# Patient Record
Sex: Female | Born: 1970 | Race: White | Hispanic: No | Marital: Married | Smoking: Never smoker
Health system: Southern US, Community
[De-identification: ages and names within clinical notes are randomized; demographics above are authoritative.]

## PROBLEM LIST (undated history)

## (undated) ENCOUNTER — Emergency Department (HOSPITAL_BASED_OUTPATIENT_CLINIC_OR_DEPARTMENT_OTHER): Admission: EM | Payer: Medicaid Other | Source: Home / Self Care

## (undated) DIAGNOSIS — Z8041 Family history of malignant neoplasm of ovary: Secondary | ICD-10-CM

## (undated) DIAGNOSIS — Z808 Family history of malignant neoplasm of other organs or systems: Secondary | ICD-10-CM

## (undated) DIAGNOSIS — Z803 Family history of malignant neoplasm of breast: Secondary | ICD-10-CM

## (undated) HISTORY — PX: BACK SURGERY: SHX140

## (undated) HISTORY — DX: Family history of malignant neoplasm of breast: Z80.3

## (undated) HISTORY — DX: Family history of malignant neoplasm of other organs or systems: Z80.8

## (undated) HISTORY — DX: Family history of malignant neoplasm of ovary: Z80.41

---

## 2000-12-12 ENCOUNTER — Other Ambulatory Visit: Admission: RE | Admit: 2000-12-12 | Discharge: 2000-12-12 | Payer: Self-pay | Admitting: Obstetrics and Gynecology

## 2001-02-19 ENCOUNTER — Emergency Department (HOSPITAL_COMMUNITY): Admission: EM | Admit: 2001-02-19 | Discharge: 2001-02-19 | Payer: Self-pay | Admitting: Emergency Medicine

## 2001-02-20 ENCOUNTER — Inpatient Hospital Stay (HOSPITAL_COMMUNITY): Admission: AD | Admit: 2001-02-20 | Discharge: 2001-02-20 | Payer: Self-pay | Admitting: Obstetrics & Gynecology

## 2001-02-21 ENCOUNTER — Encounter: Payer: Self-pay | Admitting: Obstetrics & Gynecology

## 2001-02-21 ENCOUNTER — Observation Stay (HOSPITAL_COMMUNITY): Admission: AD | Admit: 2001-02-21 | Discharge: 2001-02-21 | Payer: Self-pay | Admitting: Obstetrics & Gynecology

## 2001-05-15 ENCOUNTER — Inpatient Hospital Stay (HOSPITAL_COMMUNITY): Admission: AD | Admit: 2001-05-15 | Discharge: 2001-05-15 | Payer: Self-pay | Admitting: Obstetrics and Gynecology

## 2001-07-30 ENCOUNTER — Inpatient Hospital Stay (HOSPITAL_COMMUNITY): Admission: AD | Admit: 2001-07-30 | Discharge: 2001-08-03 | Payer: Self-pay | Admitting: *Deleted

## 2001-12-23 ENCOUNTER — Emergency Department (HOSPITAL_COMMUNITY): Admission: EM | Admit: 2001-12-23 | Discharge: 2001-12-23 | Payer: Self-pay | Admitting: Emergency Medicine

## 2001-12-23 ENCOUNTER — Encounter: Payer: Self-pay | Admitting: Emergency Medicine

## 2001-12-23 ENCOUNTER — Encounter: Payer: Self-pay | Admitting: Cardiology

## 2002-03-17 ENCOUNTER — Ambulatory Visit (HOSPITAL_COMMUNITY): Admission: RE | Admit: 2002-03-17 | Discharge: 2002-03-18 | Payer: Self-pay | Admitting: Internal Medicine

## 2002-03-18 ENCOUNTER — Encounter: Payer: Self-pay | Admitting: Internal Medicine

## 2002-03-18 ENCOUNTER — Encounter: Payer: Self-pay | Admitting: Cardiology

## 2002-05-31 ENCOUNTER — Encounter: Payer: Self-pay | Admitting: Emergency Medicine

## 2002-05-31 ENCOUNTER — Emergency Department (HOSPITAL_COMMUNITY): Admission: EM | Admit: 2002-05-31 | Discharge: 2002-05-31 | Payer: Self-pay | Admitting: Emergency Medicine

## 2002-06-23 ENCOUNTER — Encounter: Payer: Self-pay | Admitting: *Deleted

## 2002-06-23 ENCOUNTER — Encounter: Admission: RE | Admit: 2002-06-23 | Discharge: 2002-06-23 | Payer: Self-pay | Admitting: *Deleted

## 2002-07-07 ENCOUNTER — Encounter: Admission: RE | Admit: 2002-07-07 | Discharge: 2002-07-07 | Payer: Self-pay | Admitting: *Deleted

## 2002-07-07 ENCOUNTER — Encounter: Payer: Self-pay | Admitting: *Deleted

## 2002-07-15 ENCOUNTER — Encounter: Payer: Self-pay | Admitting: Family Medicine

## 2002-07-15 ENCOUNTER — Encounter: Admission: RE | Admit: 2002-07-15 | Discharge: 2002-07-15 | Payer: Self-pay | Admitting: Family Medicine

## 2002-10-17 ENCOUNTER — Encounter: Payer: Self-pay | Admitting: Emergency Medicine

## 2002-10-17 ENCOUNTER — Emergency Department (HOSPITAL_COMMUNITY): Admission: EM | Admit: 2002-10-17 | Discharge: 2002-10-17 | Payer: Self-pay | Admitting: Emergency Medicine

## 2002-12-29 ENCOUNTER — Encounter: Payer: Self-pay | Admitting: General Surgery

## 2002-12-29 ENCOUNTER — Encounter: Admission: RE | Admit: 2002-12-29 | Discharge: 2002-12-29 | Payer: Self-pay | Admitting: General Surgery

## 2003-02-15 ENCOUNTER — Encounter: Admission: RE | Admit: 2003-02-15 | Discharge: 2003-02-15 | Payer: Self-pay | Admitting: *Deleted

## 2003-02-15 ENCOUNTER — Encounter: Payer: Self-pay | Admitting: Radiology

## 2003-02-15 ENCOUNTER — Encounter: Payer: Self-pay | Admitting: *Deleted

## 2003-03-14 ENCOUNTER — Inpatient Hospital Stay (HOSPITAL_COMMUNITY): Admission: RE | Admit: 2003-03-14 | Discharge: 2003-03-16 | Payer: Self-pay | Admitting: Orthopaedic Surgery

## 2003-07-31 ENCOUNTER — Inpatient Hospital Stay (HOSPITAL_COMMUNITY): Admission: AD | Admit: 2003-07-31 | Discharge: 2003-07-31 | Payer: Self-pay | Admitting: *Deleted

## 2003-08-12 ENCOUNTER — Encounter: Payer: Self-pay | Admitting: *Deleted

## 2003-08-12 ENCOUNTER — Inpatient Hospital Stay (HOSPITAL_COMMUNITY): Admission: AD | Admit: 2003-08-12 | Discharge: 2003-08-16 | Payer: Self-pay | Admitting: *Deleted

## 2003-08-12 ENCOUNTER — Encounter (INDEPENDENT_AMBULATORY_CARE_PROVIDER_SITE_OTHER): Payer: Self-pay | Admitting: Specialist

## 2003-09-02 ENCOUNTER — Encounter: Admission: RE | Admit: 2003-09-02 | Discharge: 2003-09-02 | Payer: Self-pay | Admitting: Family Medicine

## 2003-09-20 ENCOUNTER — Encounter: Admission: RE | Admit: 2003-09-20 | Discharge: 2003-09-20 | Payer: Self-pay | Admitting: *Deleted

## 2003-09-21 ENCOUNTER — Inpatient Hospital Stay (HOSPITAL_COMMUNITY): Admission: AD | Admit: 2003-09-21 | Discharge: 2003-09-21 | Payer: Self-pay | Admitting: Obstetrics and Gynecology

## 2003-10-06 ENCOUNTER — Encounter: Admission: RE | Admit: 2003-10-06 | Discharge: 2003-10-06 | Payer: Self-pay | Admitting: *Deleted

## 2003-10-13 ENCOUNTER — Ambulatory Visit (HOSPITAL_COMMUNITY): Admission: RE | Admit: 2003-10-13 | Discharge: 2003-10-13 | Payer: Self-pay | Admitting: *Deleted

## 2003-11-03 ENCOUNTER — Encounter: Admission: RE | Admit: 2003-11-03 | Discharge: 2003-11-03 | Payer: Self-pay | Admitting: Family Medicine

## 2003-11-17 ENCOUNTER — Encounter: Admission: RE | Admit: 2003-11-17 | Discharge: 2003-11-17 | Payer: Self-pay | Admitting: Family Medicine

## 2003-12-01 ENCOUNTER — Encounter: Admission: RE | Admit: 2003-12-01 | Discharge: 2003-12-01 | Payer: Self-pay | Admitting: Family Medicine

## 2003-12-15 ENCOUNTER — Encounter: Admission: RE | Admit: 2003-12-15 | Discharge: 2003-12-15 | Payer: Self-pay | Admitting: Family Medicine

## 2003-12-22 ENCOUNTER — Encounter: Admission: RE | Admit: 2003-12-22 | Discharge: 2003-12-22 | Payer: Self-pay | Admitting: *Deleted

## 2003-12-22 ENCOUNTER — Inpatient Hospital Stay (HOSPITAL_COMMUNITY): Admission: RE | Admit: 2003-12-22 | Discharge: 2003-12-22 | Payer: Self-pay | Admitting: Family Medicine

## 2003-12-29 ENCOUNTER — Encounter: Admission: RE | Admit: 2003-12-29 | Discharge: 2003-12-29 | Payer: Self-pay | Admitting: Family Medicine

## 2004-01-12 ENCOUNTER — Encounter: Admission: RE | Admit: 2004-01-12 | Discharge: 2004-01-12 | Payer: Self-pay | Admitting: Family Medicine

## 2004-01-19 ENCOUNTER — Encounter: Admission: RE | Admit: 2004-01-19 | Discharge: 2004-01-19 | Payer: Self-pay | Admitting: Family Medicine

## 2004-02-16 ENCOUNTER — Ambulatory Visit: Payer: Self-pay | Admitting: Family Medicine

## 2004-02-23 ENCOUNTER — Ambulatory Visit: Payer: Self-pay | Admitting: Family Medicine

## 2004-03-01 ENCOUNTER — Ambulatory Visit: Payer: Self-pay | Admitting: Family Medicine

## 2004-03-15 ENCOUNTER — Ambulatory Visit: Payer: Self-pay | Admitting: Family Medicine

## 2004-03-15 ENCOUNTER — Inpatient Hospital Stay (HOSPITAL_COMMUNITY): Admission: AD | Admit: 2004-03-15 | Discharge: 2004-03-15 | Payer: Self-pay | Admitting: Family Medicine

## 2004-03-19 ENCOUNTER — Encounter (INDEPENDENT_AMBULATORY_CARE_PROVIDER_SITE_OTHER): Payer: Self-pay | Admitting: Specialist

## 2004-03-19 ENCOUNTER — Inpatient Hospital Stay (HOSPITAL_COMMUNITY): Admission: AD | Admit: 2004-03-19 | Discharge: 2004-03-21 | Payer: Self-pay | Admitting: Family Medicine

## 2004-04-07 ENCOUNTER — Inpatient Hospital Stay (HOSPITAL_COMMUNITY): Admission: AD | Admit: 2004-04-07 | Discharge: 2004-04-07 | Payer: Self-pay | Admitting: *Deleted

## 2004-05-22 ENCOUNTER — Encounter: Admission: RE | Admit: 2004-05-22 | Discharge: 2004-05-22 | Payer: Self-pay | Admitting: Family Medicine

## 2005-01-06 ENCOUNTER — Inpatient Hospital Stay (HOSPITAL_COMMUNITY): Admission: AD | Admit: 2005-01-06 | Discharge: 2005-01-06 | Payer: Self-pay | Admitting: *Deleted

## 2005-01-17 ENCOUNTER — Ambulatory Visit: Payer: Self-pay | Admitting: Family Medicine

## 2005-02-28 ENCOUNTER — Ambulatory Visit: Payer: Self-pay | Admitting: Obstetrics & Gynecology

## 2005-03-21 ENCOUNTER — Ambulatory Visit: Payer: Self-pay | Admitting: Family Medicine

## 2005-04-11 ENCOUNTER — Ambulatory Visit: Payer: Self-pay | Admitting: Family Medicine

## 2005-04-17 ENCOUNTER — Ambulatory Visit (HOSPITAL_COMMUNITY): Admission: RE | Admit: 2005-04-17 | Discharge: 2005-04-17 | Payer: Self-pay | Admitting: Obstetrics and Gynecology

## 2005-05-02 ENCOUNTER — Ambulatory Visit: Payer: Self-pay | Admitting: *Deleted

## 2005-06-06 ENCOUNTER — Inpatient Hospital Stay (HOSPITAL_COMMUNITY): Admission: AD | Admit: 2005-06-06 | Discharge: 2005-06-06 | Payer: Self-pay | Admitting: *Deleted

## 2005-06-06 ENCOUNTER — Ambulatory Visit: Payer: Self-pay | Admitting: Family Medicine

## 2005-06-20 ENCOUNTER — Ambulatory Visit: Payer: Self-pay | Admitting: Family Medicine

## 2005-07-11 ENCOUNTER — Ambulatory Visit: Payer: Self-pay | Admitting: Family Medicine

## 2005-07-25 ENCOUNTER — Ambulatory Visit: Payer: Self-pay | Admitting: Family Medicine

## 2005-08-08 ENCOUNTER — Ambulatory Visit: Payer: Self-pay | Admitting: Family Medicine

## 2005-08-15 ENCOUNTER — Ambulatory Visit (HOSPITAL_COMMUNITY): Admission: RE | Admit: 2005-08-15 | Discharge: 2005-08-15 | Payer: Self-pay | Admitting: Obstetrics and Gynecology

## 2005-08-15 ENCOUNTER — Ambulatory Visit: Payer: Self-pay | Admitting: Family Medicine

## 2005-08-29 ENCOUNTER — Ambulatory Visit: Payer: Self-pay | Admitting: Gynecology

## 2005-08-31 ENCOUNTER — Inpatient Hospital Stay (HOSPITAL_COMMUNITY): Admission: AD | Admit: 2005-08-31 | Discharge: 2005-09-02 | Payer: Self-pay | Admitting: Family Medicine

## 2005-08-31 ENCOUNTER — Ambulatory Visit: Payer: Self-pay | Admitting: Family Medicine

## 2005-09-05 ENCOUNTER — Encounter: Admission: RE | Admit: 2005-09-05 | Discharge: 2005-10-05 | Payer: Self-pay | Admitting: Family Medicine

## 2005-09-05 ENCOUNTER — Inpatient Hospital Stay (HOSPITAL_COMMUNITY): Admission: AD | Admit: 2005-09-05 | Discharge: 2005-09-05 | Payer: Self-pay | Admitting: Family Medicine

## 2005-09-05 ENCOUNTER — Ambulatory Visit: Admission: RE | Admit: 2005-09-05 | Discharge: 2005-09-05 | Payer: Self-pay | Admitting: Family Medicine

## 2005-10-06 ENCOUNTER — Encounter: Admission: RE | Admit: 2005-10-06 | Discharge: 2005-11-04 | Payer: Self-pay | Admitting: Family Medicine

## 2005-10-24 ENCOUNTER — Ambulatory Visit: Payer: Self-pay | Admitting: Family Medicine

## 2005-11-05 ENCOUNTER — Encounter: Admission: RE | Admit: 2005-11-05 | Discharge: 2005-12-05 | Payer: Self-pay | Admitting: Family Medicine

## 2005-12-06 ENCOUNTER — Encounter: Admission: RE | Admit: 2005-12-06 | Discharge: 2006-01-04 | Payer: Self-pay | Admitting: Family Medicine

## 2005-12-27 ENCOUNTER — Encounter (INDEPENDENT_AMBULATORY_CARE_PROVIDER_SITE_OTHER): Payer: Self-pay | Admitting: Specialist

## 2005-12-27 ENCOUNTER — Ambulatory Visit: Payer: Self-pay | Admitting: Family Medicine

## 2005-12-27 ENCOUNTER — Other Ambulatory Visit: Admission: RE | Admit: 2005-12-27 | Discharge: 2005-12-27 | Payer: Self-pay | Admitting: Obstetrics and Gynecology

## 2006-01-05 ENCOUNTER — Encounter: Admission: RE | Admit: 2006-01-05 | Discharge: 2006-02-04 | Payer: Self-pay | Admitting: Family Medicine

## 2007-03-12 ENCOUNTER — Encounter: Admission: RE | Admit: 2007-03-12 | Discharge: 2007-03-12 | Payer: Self-pay | Admitting: Obstetrics and Gynecology

## 2008-11-17 ENCOUNTER — Emergency Department (HOSPITAL_BASED_OUTPATIENT_CLINIC_OR_DEPARTMENT_OTHER): Admission: EM | Admit: 2008-11-17 | Discharge: 2008-11-17 | Payer: Self-pay | Admitting: Emergency Medicine

## 2008-11-17 ENCOUNTER — Ambulatory Visit: Payer: Self-pay | Admitting: Radiology

## 2008-11-22 ENCOUNTER — Ambulatory Visit (HOSPITAL_COMMUNITY): Admission: RE | Admit: 2008-11-22 | Discharge: 2008-11-22 | Payer: Self-pay | Admitting: Emergency Medicine

## 2008-12-10 ENCOUNTER — Emergency Department (HOSPITAL_BASED_OUTPATIENT_CLINIC_OR_DEPARTMENT_OTHER): Admission: EM | Admit: 2008-12-10 | Discharge: 2008-12-10 | Payer: Self-pay | Admitting: Emergency Medicine

## 2008-12-22 ENCOUNTER — Ambulatory Visit (HOSPITAL_COMMUNITY): Admission: RE | Admit: 2008-12-22 | Discharge: 2008-12-22 | Payer: Self-pay | Admitting: Neurosurgery

## 2008-12-22 ENCOUNTER — Encounter: Admission: RE | Admit: 2008-12-22 | Discharge: 2008-12-22 | Payer: Self-pay | Admitting: Neurosurgery

## 2008-12-23 ENCOUNTER — Ambulatory Visit: Payer: Self-pay | Admitting: Internal Medicine

## 2008-12-23 ENCOUNTER — Observation Stay (HOSPITAL_COMMUNITY): Admission: EM | Admit: 2008-12-23 | Discharge: 2008-12-26 | Payer: Self-pay | Admitting: Emergency Medicine

## 2009-04-23 ENCOUNTER — Ambulatory Visit (HOSPITAL_COMMUNITY): Admission: RE | Admit: 2009-04-23 | Discharge: 2009-04-23 | Payer: Self-pay | Admitting: Neurosurgery

## 2009-05-03 ENCOUNTER — Ambulatory Visit (HOSPITAL_COMMUNITY): Admission: RE | Admit: 2009-05-03 | Discharge: 2009-05-03 | Payer: Self-pay | Admitting: Obstetrics

## 2009-08-15 ENCOUNTER — Ambulatory Visit: Payer: Self-pay | Admitting: Diagnostic Radiology

## 2009-08-15 ENCOUNTER — Emergency Department (HOSPITAL_BASED_OUTPATIENT_CLINIC_OR_DEPARTMENT_OTHER): Admission: EM | Admit: 2009-08-15 | Discharge: 2009-08-15 | Payer: Self-pay | Admitting: Emergency Medicine

## 2009-10-21 ENCOUNTER — Ambulatory Visit (HOSPITAL_COMMUNITY): Admission: RE | Admit: 2009-10-21 | Discharge: 2009-10-21 | Payer: Self-pay | Admitting: Neurosurgery

## 2010-06-06 IMAGING — CR DG CERVICAL SPINE 2 OR 3 VIEWS
1 series · 1 of 1 positions shown · non-contrast
Comparison: None.

CLINICAL DATA: ACDF.

CERVICAL SPINE - 2-3 VIEW

[view not recorded]
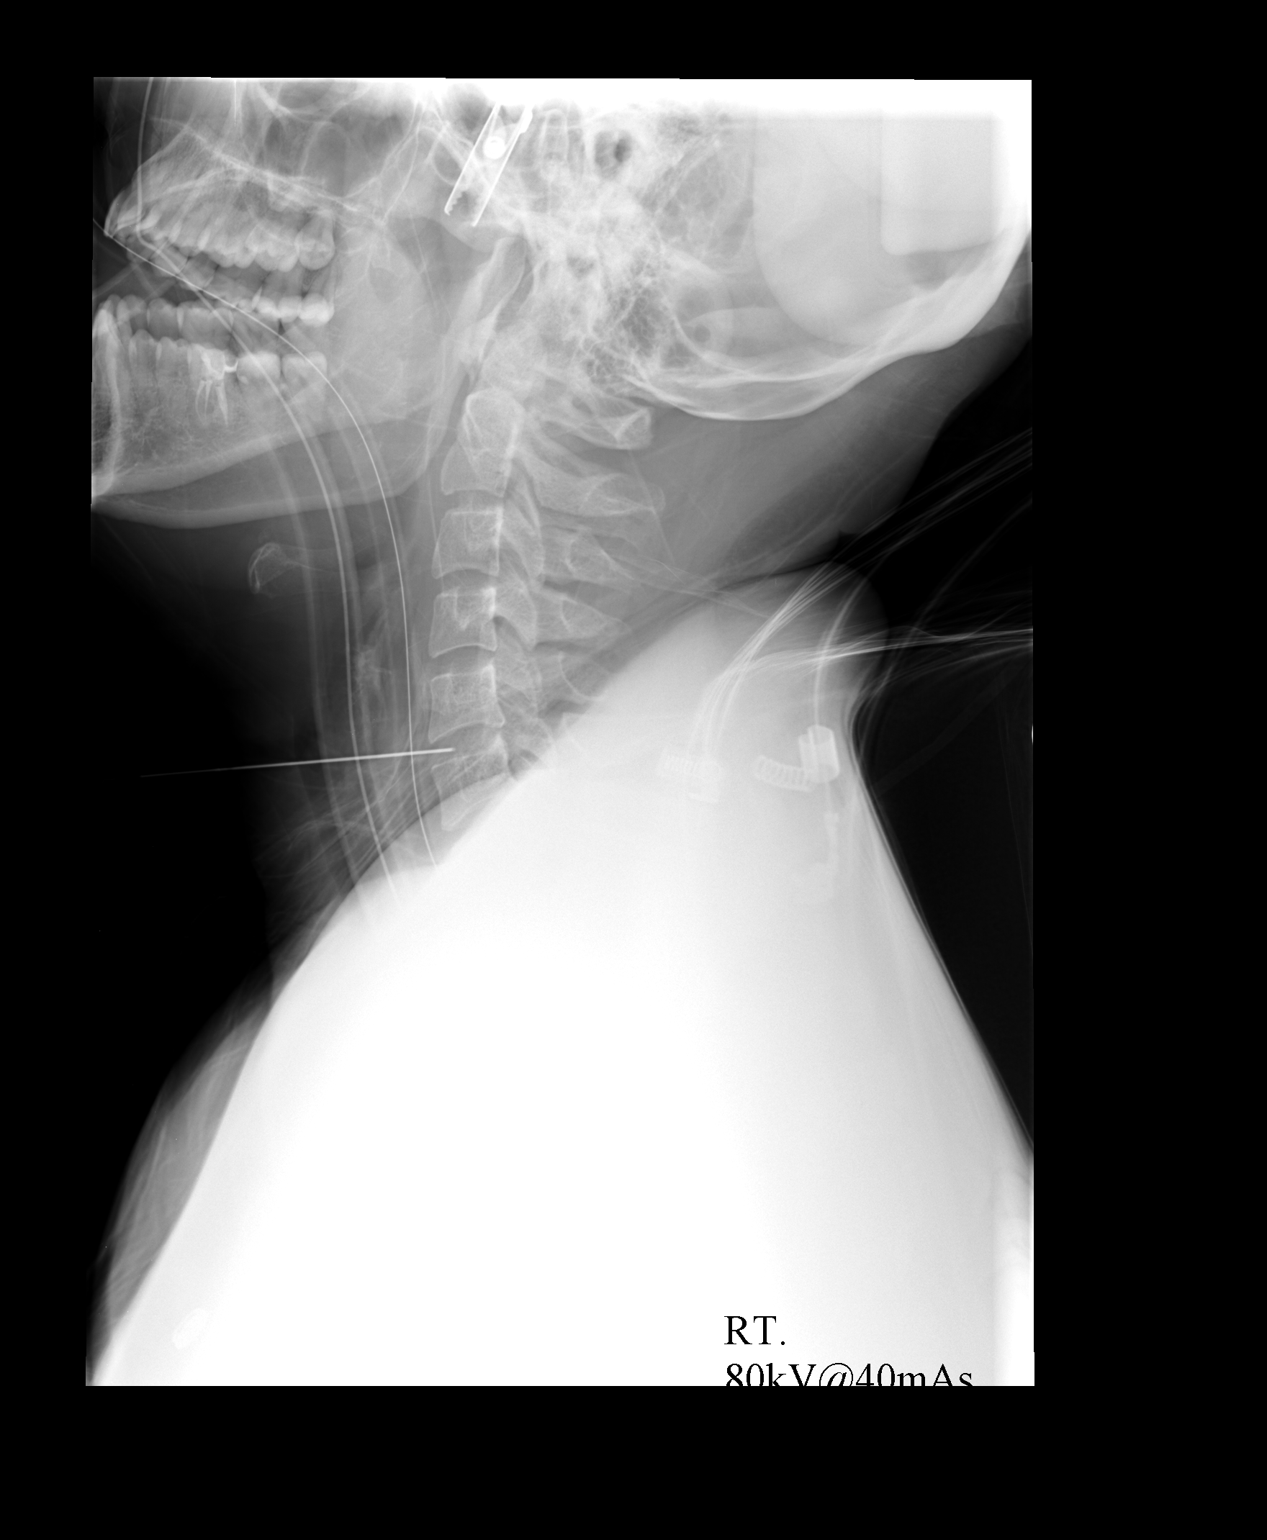

[1 of 1 positions shown; findings below may reference images not displayed]

FINDINGS: Cross-table lateral view of the cervical spine obtained
portably at 4211 hours is labeled #1.  There is a radiopaque needle
seen from an anterior approach.  Needle tip overlies the anterior
C5-6 interspace.

The film labeled #2 is obtained at 7771 hours.  The patient has had
interval cervical discectomy at C6-7 with anterior plate seen
across the C6-7 interspace.  The caudal extent of the hardware is
not visualized.
IMPRESSION: Status post ACDF at C6-7.  The C7 level has not been clearly
visualized secondary to overlying soft tissue.

## 2010-07-07 ENCOUNTER — Encounter: Payer: Self-pay | Admitting: Family Medicine

## 2010-09-10 ENCOUNTER — Ambulatory Visit: Payer: Self-pay | Admitting: Advanced Practice Midwife

## 2010-09-23 LAB — DIFFERENTIAL
Eosinophils Relative: 0 % (ref 0–5)
Lymphs Abs: 1.4 10*3/uL (ref 0.7–4.0)
Monocytes Absolute: 0.4 10*3/uL (ref 0.1–1.0)
Monocytes Relative: 4 % (ref 3–12)
Neutro Abs: 7 10*3/uL (ref 1.7–7.7)

## 2010-09-23 LAB — POCT I-STAT, CHEM 8
BUN: 13 mg/dL (ref 6–23)
Calcium, Ion: 1.21 mmol/L (ref 1.12–1.32)
Chloride: 110 mEq/L (ref 96–112)
Creatinine, Ser: 0.5 mg/dL (ref 0.4–1.2)
Glucose, Bld: 97 mg/dL (ref 70–99)
HCT: 36 % (ref 36.0–46.0)
Hemoglobin: 12.2 g/dL (ref 12.0–15.0)
Potassium: 4.2 mEq/L (ref 3.5–5.1)
Sodium: 141 mEq/L (ref 135–145)
TCO2: 23 mmol/L (ref 0–100)

## 2010-09-23 LAB — HEPATITIS PANEL, ACUTE
HCV Ab: NEGATIVE
Hep A IgM: NEGATIVE
Hepatitis B Surface Ag: NEGATIVE

## 2010-09-23 LAB — PROTIME-INR
INR: 1 (ref 0.00–1.49)
Prothrombin Time: 13.9 s (ref 11.6–15.2)

## 2010-09-23 LAB — COMPREHENSIVE METABOLIC PANEL
ALT: 47 U/L — ABNORMAL HIGH (ref 0–35)
Alkaline Phosphatase: 34 U/L — ABNORMAL LOW (ref 39–117)
BUN: 9 mg/dL (ref 6–23)
Calcium: 8.5 mg/dL (ref 8.4–10.5)
Total Protein: 6.5 g/dL (ref 6.0–8.3)

## 2010-09-23 LAB — PREGNANCY, URINE: Preg Test, Ur: NEGATIVE

## 2010-09-23 LAB — CBC
HCT: 33.3 % — ABNORMAL LOW (ref 36.0–46.0)
Hemoglobin: 11.6 g/dL — ABNORMAL LOW (ref 12.0–15.0)
MCHC: 35 g/dL (ref 30.0–36.0)
RDW: 13 % (ref 11.5–15.5)
WBC: 8.9 10*3/uL (ref 4.0–10.5)

## 2010-09-23 LAB — APTT: aPTT: 28 seconds (ref 24–37)

## 2010-09-24 LAB — POCT CARDIAC MARKERS
CKMB, poc: <1 ng/mL — ABNORMAL LOW (ref 1.0–8.0)
Myoglobin, poc: 33.5 ng/mL (ref 12–200)
Troponin i, poc: <0.05 ng/mL (ref 0.00–0.09)

## 2010-09-28 ENCOUNTER — Other Ambulatory Visit: Payer: Self-pay | Admitting: Family

## 2010-09-28 ENCOUNTER — Ambulatory Visit (INDEPENDENT_AMBULATORY_CARE_PROVIDER_SITE_OTHER): Payer: Self-pay

## 2010-09-28 DIAGNOSIS — Z01419 Encounter for gynecological examination (general) (routine) without abnormal findings: Secondary | ICD-10-CM

## 2010-09-29 NOTE — Progress Notes (Signed)
NAME:  [name] [name], [name] [name]                  ACCOUNT NO.:  [id number]  MEDICAL RECORD NO.:  [id number]           PATIENT TYPE:  A  LOCATION:  [facility]                   FACILITY:  [facility]  PHYSICIAN:  Sid Falcon, CNM  DATE OF BIRTH:  April 23, 1971  DATE OF SERVICE:  09/28/2010                                 CLINIC NOTE  REASON FOR VISIT:  For IUD removal and consultation.  HISTORY OF PRESENT ILLNESS:  The patient is here with report of having Mirena IUD placed 5 years ago due for removal at the beginning of May 2012.  The patient is here wanting to apply for a [facility] scholarship for Mirena, unable to secure additional financial means for placement.  In addition, the patient is here for an annual well-woman exam.  MEDICATIONS:  None.  MENSTRUAL HISTORY:  Last menstrual period, September 16, 2010 in a monogamous relationship, not trying to get pregnant history.  OBSTETRICAL HISTORY:  Five pregnancies with four children and a history of one ectopic pregnancy in 2005.  GYNECOLOGIC HISTORY:  Last Pap smear 5 years ago.  The patient denies history of abnormal Pap.  STD HISTORY:  Denies.  PAST MEDICAL HISTORY:  Supraventricular tachycardia and neck pain and back pain.  PAST SURGICAL HISTORY:  Cervical decompression in 2001 and also had L4- L5 microdiskectomy.  Had a right salpingectomy and oophorectomy in 2005 and had a cardiac ablation to correct SBT in 2003.  SOCIAL HISTORY:  The patient is not working, does not smoke, drink alcohol, or use illicit drugs.  Denies sexual or physical abuse.  FAMILY HISTORY:  Mother with diabetes.  Father with heart disease. Mother with high blood pressure.  Mother with breast cancer diagnosed at age 31.  REVIEW OF SYSTEMS:  The patient denies any problems or concerns at this visit.  PHYSICAL EXAMINATION:  VITAL SIGNS:  Stable.  Temperature 99, pulse 85, and blood pressure 108/75. GENERAL:  Alert and oriented x3.  No signs of acute  distress. NECK:  No thyromegaly, no dominant masses, and no nodules. CARDIOVASCULAR:  Regular rate and rhythm without murmurs, gallops, or rubs. LUNGS:  Clear to auscultation bilaterally. BREASTS:  Soft, nontender, and no dominant masses.  No retractions.  No dimpling.  No nipple discharge. ABDOMEN:  No hepatosplenomegaly. PELVIS:  Vagina:  No abnormal lesions, no abnormal discharge.  Cervix: Visualized without difficulty, no abnormal lesions, no abnormal discharge, negative cervical motion tenderness.  Adnexa:  No dominant masses, nontender with palpation.  ASSESSMENT: 1. Well-woman exam. 2. Family planning counseling.  PLAN:  The patient to apply for both the [employer] scholarship and [employer].  LABORATORY DATA:  Pap smear sent to lab.  The patient will follow up for hopeful IUD removal and insertion at next visit.     Sid Falcon, CNM    WM/MEDQ  D:  09/28/2010  T:  09/29/2010  Job:  [id number]

## 2010-10-22 ENCOUNTER — Other Ambulatory Visit: Payer: Self-pay | Admitting: Family Medicine

## 2010-10-22 DIAGNOSIS — Z1231 Encounter for screening mammogram for malignant neoplasm of breast: Secondary | ICD-10-CM

## 2010-10-23 ENCOUNTER — Ambulatory Visit (HOSPITAL_COMMUNITY): Payer: Self-pay

## 2010-10-23 ENCOUNTER — Ambulatory Visit (HOSPITAL_COMMUNITY)
Admission: RE | Admit: 2010-10-23 | Discharge: 2010-10-23 | Disposition: A | Discharge status: Home / Self Care | Payer: Self-pay | Source: Ambulatory Visit | Attending: Family Medicine | Admitting: Family Medicine

## 2010-10-23 DIAGNOSIS — Z1231 Encounter for screening mammogram for malignant neoplasm of breast: Secondary | ICD-10-CM

## 2010-10-29 ENCOUNTER — Ambulatory Visit (HOSPITAL_COMMUNITY): Payer: Self-pay

## 2010-10-30 NOTE — Discharge Summary (Signed)
NAME:  Vessell, [name]                  ACCOUNT NO.:  [id number]   MEDICAL RECORD NO.:  [id number]          PATIENT TYPE:  OBV   LOCATION:  3023                         FACILITY:  [facility]   PHYSICIAN:  Coletta Memos, M.D.     DATE OF BIRTH:  July 08, 1970   DATE OF ADMISSION:  12/23/2008  DATE OF DISCHARGE:  12/26/2008                               DISCHARGE SUMMARY   ADMITTING DIAGNOSIS:  Neck pain.   DISCHARGE DIAGNOSES:  1. 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  2. Left C7 radiculopathy.   INDICATIONS:  [name] [name] is a 40 year old who had been seen by my  partner Dr. Trey Sailors for evaluation of a herniated disk at C6-7 on left  side.  He arranged and she agreed to undergo epidural steroid  injections.  She had a first injection on Thursday, December 22, 2008.  She  experienced greater pain in left upper extremity and came to the  hospital emergency room.  She was seen and admitted by the Medicine  Service.  At that time, they are recorded her as having a strength of  1/5 in the left upper extremity.  However, there is absolutely nothing  to believe that she had a flaccid extremity.  They spoke with Dr. Channing Mutters  and developed a plan.  However, over the weekend, they asked for another  consultation with regards to Ms. [name].  I then at that time offered her  operative decompression that she would like and she agreed.  We did this  yesterday, December 25, 2008.  She had an anterior cervical decompression  and arthrodesis C6-7, with factor plate.  Postoperatively, she has done  well.  She has some numbness in the upper and her fingers on the left  hand.  Outside of that she is doing well.  Wound is clean and dry with  no signs of infection.  She is tolerating a regular diet, voiding and  ambulating without difficulty.  Wound is clean and dry.  She will be  discharged home.   MEDICATIONS:  Dilaudid and Flexeril.           ______________________________  Coletta Memos, M.D.     KC/MEDQ  D:  12/26/2008  T:   12/27/2008  Job:  [id number]

## 2010-10-30 NOTE — Discharge Summary (Signed)
NAME:  [name], [name]                  ACCOUNT NO.:  [id number]   MEDICAL RECORD NO.:  [id number]          PATIENT TYPE:  OBV   LOCATION:  3023                         FACILITY:  [facility]   PHYSICIAN:  Alvester Morin, M.D.  DATE OF BIRTH:  1971-04-28   DATE OF ADMISSION:  12/23/2008  DATE OF DISCHARGE:  12/26/2008                               DISCHARGE SUMMARY   ATTENDING PHYSICIAN:  Alvester Morin, M.D.   DISCHARGE DIAGNOSES:  1. Displaced disk cervical spine left C6 to 7 with a left C7      radiculopathy.  2. Elevated transaminases.   DISCHARGE MEDICATIONS:  Per Neurosurgery were:  1. Dilaudid 1 mg q.4 h. as needed for pain.  2. Valium 5 mg q.8 h. as needed for pain.   DISPOSITION AND FOLLOWUP:  The patient is to follow up with Dr. Coletta Memos, Neurosurgery in 2 to 3 weeks, following the surgery that was  performed during this admission.   PROCEDURES PERFORMED:  1. Anterior cervical decompression C6 to 7.  2. MRI of the cervical spine with and without contrast.   CONSULTATION:  Coletta Memos, M.D., Neurosurgery.   ADMITTING HISTORY AND PHYSICAL:  The patient is a 40 year old woman with  a past medical history significant for C6 to 7 disk herniation,  diagnosed via MRI about 1 month prior to admission and she is status  post an epidural injection near that cervical disk herniation site.  The  day prior to admission on December 22, 2008, she presented to the emergency  department complaining of severe neck pain which radiated to the left  arm, that started to the night prior to admission.  She describes the  pain as electric shock, sharp, fired, 10 out of 10 in intensity and she  also relates having numbness and that has been getting worse since the  day prior to admission.  She also complains of weakness in that left arm  which started at the same time as the numbness and increased pain.  She  also has a headache in the occipital area and which also began the day  prior to  admission.  She denies any fevers but does report chills,  sweating, and feeling hot.   ADMITTING LABS:  Upon arrival in the emergency department, the patient  did have point-of-care cardiac markers drawn, which showed a CK-MB of  less than 1.0, troponin I of less than 0.05 and myoglobin of 33.5, all  of which were within normal limits.  A STAT chemistry was drawn with  ionized calcium of 1.21, hemoglobin of 12.2, and hematocrit 36.0.  Sodium 141, potassium 4.2, chloride 110, glucose 97, BUN of 13, and  creatinine of 0.5.  CBC was drawn, which showed a white blood count of  8.9, hemoglobin of 11.6, hematocrit 33.3, platelets of 226.  Comprehensive metabolic panel showed sodium of 140, potassium 4.0,  chloride 113, bicarb 22, glucose 98, BUN 9, creatinine 0.46, a bilirubin  total 0.5, alkaline phosphatase 34, AST 43, ALT 47, total protein 6.5,  albumin 3.7, and calcium of 8.5.  Urine  pregnancy test was negative.   HOSPITAL COURSE BY PROBLEM:  1. Cervical disk herniation with C7 radiculopathy.  The patient was      admitted and she was began on a morphine patient controlled      analgesic and also Neurontin 300 mg 3 times daily, per      recommendations of Dr. Channing Mutters who had seen the patient in the past for      neck pain prior to her epidural injection there the day prior to      admission. The patient was seen the following day by colleague of      Dr. Channing Mutters, Dr. Coletta Memos who then performed a ACDF on Sunday July      11 , 2010.  The patient was seen the following day after surgery and      appeared much better with pain relief and was going to be followed      up as an outpatient by Dr. Franky Macho in 2 to 3 weeks.  He had written      her for pain medications as an outpatient and she was to take those      as directed from Dr. Franky Macho.  2. Elevated transaminases.  The patient was found to have an AST and      ALT of 43 and 47 respectively upon admission.  Hepatitis panel was      drawn,  which returned as being negative with a preliminary report.   DISCHARGE LABS AND VITALS:  On the day of discharge, the patient had  vitals of temperature 98.6, pulse 81, respirations 19, and blood  pressure of 107/68.  Labs on discharge included only the preliminary  results from the hepatitis panel, which were both negative for hepatitis  B surface antigen and hepatitis C antibody.      Nilda Riggs, MD  Electronically Signed      Alvester Morin, M.D.  Electronically Signed    ME/MEDQ  D:  12/26/2008  T:  12/27/2008  Job:  [id number]   cc:   Coletta Memos, M.D.

## 2010-10-30 NOTE — Op Note (Signed)
NAME:  [name] [name], [name] [name]                  ACCOUNT NO.:  [id number]   MEDICAL RECORD NO.:  [id number]          PATIENT TYPE:  OBV   LOCATION:  3023                         FACILITY:  [facility]   PHYSICIAN:  Coletta Memos, M.D.     DATE OF BIRTH:  02/14/1971   DATE OF PROCEDURE:  12/25/2008  DATE OF DISCHARGE:                               OPERATIVE REPORT   PREOPERATIVE DIAGNOSES:  1. 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  2. Left C7 radiculopathy.   POSTOPERATIVE DIAGNOSES:  1. 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  2. Left C7 radiculopathy.   PROCEDURES:  1. Anterior cervical decompression, C6-7.  2. Arthrodesis, C6-7 with a 7-mm structural allograft.  3. Anterior instrumentation with 14-mm Vectra plate with 14 mm screws      at C6-7.   COMPLICATIONS:  None.   SURGEON:  Coletta Memos, MD   ASSISTANT:  None.   ANESTHESIA:  General endotracheal.   INDICATIONS:  [name] [name] Potts presented with severe pain in the left upper  extremity after epidural steroid injection.  She had presented to the  [facility] with a very large herniated disk at C6-7.  With no  relief of her symptoms and with significant emotional distress on a part  of the patient, I felt the best to offer an operation and operative  decompression and she and her husband have agreed.   OPERATIVE NOTE:  Ms. Heumann was brought to the operating room, intubated,  and placed under general anesthetic.  The head was positioned on a  horseshoe headrest in a neutral position.  Her neck was prepped.  She  was draped in a sterile fashion.  I infiltrated 3 mL of 0.5% lidocaine  with 1:200,000 strength epinephrine at the level of the cricothyroid  cartilage.  I made an incision starting from the midline extending to  the medial border of the left sternocleidomastoid.  I opened the skin  and dissected to the platysma with Metzenbaum scissors.  I dissected in  a plane both superior and inferior to the platysma in a rostral and  caudal directions to increase my  working space.  After dividing the  platysma, I was able to dissect with both blunt and sharp techniques and  created an avascular corridor to the cervical spine.  I placed a spinal  needle, I was at C5-6.  I moved down one level.  I reflected the longus  colli muscles bilaterally overlying the C6-7 space.  I placed self-  retaining retractors and prepared for the decompression.  I also placed  2 distraction pins, one at C6 and one at C7.  I used a #15 blade, I  opened the disk space then with a #15 blade by incising the annulus and  then I distracted the space.   I decompressed the spinal canal at C6-7 by removing the disk and  herniated disk material prominent in both right and left sides, though  she had no right-sided symptoms.  I was able to fully decompressed both  C7 nerve roots.  I used a high-speed drill, Kerrison punches, and  pituitary rongeurs along  with curettes to perform the decompression.  Once that was performed, I turned my attention to the arthrodesis.   I sized the space and felt a 7-mm structural allograft would be best.  I  placed that without difficulty after using a high-speed bur to even the  surfaces at C6 and C7.  I irrigated.  I then placed anterior  instrumentation.   I used a 14-mm plate and four 54-UJ screws to span the disk space at C6-  7.  This was done without difficulty using self-tapping screws.  I then  irrigated the wound.  An x-ray was performed and it showed that the  plate plug-in screws were in the correct location.  I then closed the  wound in a layered fashion using Vicryl sutures to reapproximate the  platysma in subcuticular layers.  I used Dermabond for sterile dressing.  Ms. Badley was awakened, extubated, and moving all extremities well.           ______________________________  Coletta Memos, M.D.     KC/MEDQ  D:  12/25/2008  T:  12/26/2008  Job:  [id number]

## 2010-11-02 NOTE — Group Therapy Note (Signed)
NAME:  [name] [name], [name] [name]                  ACCOUNT NO.:  [id number]   MEDICAL RECORD NO.:  [id number]          PATIENT TYPE:  WOC   LOCATION:  [facility]                   FACILITY:  [facility]   PHYSICIAN:  Tinnie Gens, MD        DATE OF BIRTH:  [birth year]   DATE OF SERVICE:  12/27/2005                                    CLINIC NOTE   CHIEF COMPLAINT:  Vulvar irritation.   HISTORY OF PRESENT ILLNESS:  This patient is a 40 year old gravida 4, para 4-  0-1-4 who had an IUD inserted in May of this year.  She states that she has  had some spotting pretty much ever since that was inserted.  The patient  continues to breast feed.   The patient also complains of vulvar irritation.  She has noticed some spots  that sort of swell up and are very tender to touch and then go away.  They  come and go on different sides, have been present for about 3 weeks.  Her  partner does have a history of HSV, but she has never had an outbreak.  The  patient also notes just generalized vulvar irritation and itching that has  been present for some time and is pretty much constant.   PHYSICAL EXAMINATION:  VITAL SIGNS:  On exam today her vitals are noted in  the chart.  GENERAL:  She is a well-developed, well-nourished [place] female in  no acute distress.  GU:  Her vulva are very erythematous.  She does have some areas consistent  with folliculitis noted and probably are a response to shaving.  There is  one that is healing and is somewhat open, and an HSV culture was taken from  this spot.  The vagina is pink and rugae.  The cervix is without lesion.  IUD strings are present and visible.  Also noted is the presence of atrophy  of the labia minora and fusion to the underlying sidewall.  There is no  definitive whitening of the vulvar skin, but this is suspicious for lichen  sclerosus.   PROCEDURE:  After informed consent 0.5 mL of lidocaine was infiltrated into  the vulva and a punch biopsy was done with a  3.5-mm punch.  The patient  tolerated the procedure well, and hemostasis was obtained with silver  nitrate.   IMPRESSION:  1.  Spotting and bleeding after Mirena intrauterine device insertion.  The      intrauterine device is in normal position.  2.  Vulvar irritation and atrophy.  3.  Probable folliculitis.   PLAN:  1.  An HSV culture sent.  2.  Biopsy of the atrophic area was also performed.  3.  The patient was given a sample pack of Loestrin to see if this would      curb her bleeding.  4.  The patient will follow up in 2 weeks for results of her biopsy and      possible treatment options.  5.  The patient is also instructed to stop shaving and just use trimmers as      needed.  ______________________________  Tinnie Gens, MD     TP/MEDQ  D:  12/27/2005  T:  12/27/2005  Job:  [id number]

## 2010-11-02 NOTE — Group Therapy Note (Signed)
NAME:  [name] [name], [name] [name]                  ACCOUNT NO.:  [id number]   MEDICAL RECORD NO.:  [id number]          PATIENT TYPE:  WOC   LOCATION:  [facility]                   FACILITY:  [facility]   PHYSICIAN:  Tinnie Gens, MD        DATE OF BIRTH:  08/29/1970   DATE OF SERVICE:  10/24/2005                                    CLINIC NOTE   CHIEF COMPLAINT:  IV insertion.   HISTORY OF PRESENT ILLNESS:  The patient is a 40 year old gravida 4, para 4-  0-1-4 who had a heterotopic pregnancy but delivered a baby in March 2007 who  desires IUD insertion.  She continues to nurse.  She is doing well.  She has  no significant complaints.  She just had her first period that started  approximately four days ago.   PROCEDURES:  Speculum was placed inside the vagina.  Cervix was visualized,  grasped anteriorly with single toothed tenaculum.  Blood was noted coming  from the cervix.  The uterus was sounded to 8 cm without difficult.  Mirena  IUD was inserted without difficulty.  The strings trimmed to 1.5 cm.  The  patient tolerated the procedure well.   IMPRESSION:  IUD insertion.   PLAN:  The patient was encouraged to check the strings once a month for the  first few months.  She is cautioned about abnormal uterine bleeding, and was  told to follow up in three months for a Pap smear.           ______________________________  Tinnie Gens, MD     TP/MEDQ  D:  10/24/2005  T:  10/25/2005  Job:  [id number]

## 2010-11-02 NOTE — Op Note (Signed)
NAME:  [name] [name], [name] [name]                            ACCOUNT NO.:  [id number]   MEDICAL RECORD NO.:  [id number]                   PATIENT TYPE:  MAT   LOCATION:  [facility]                                 [facility]   PHYSICIAN:  Tanya S. Shawnie Pons, M.D.                DATE OF BIRTH:  1971/02/18   DATE OF PROCEDURE:  08/12/2003  DATE OF DISCHARGE:                                 OPERATIVE REPORT   PREOPERATIVE DIAGNOSES:  1. Intrauterine pregnancy at eight weeks.  2. Right ectopic, ruptured.   POSTOPERATIVE DIAGNOSES:  1. Intrauterine pregnancy at eight weeks.  2. Right ectopic, ruptured.   PROCEDURE:  Exploratory laparotomy and right salpingo-oophorectomy.   SURGEON:  Shelbie Proctor. Shawnie Pons, M.D.   ASSISTANTSamule Ohm A. Normand Sloop, M.D.   ANESTHESIA:  General, Raul Del, M.D.   FINDINGS:  At least 2 L of blood in the abdomen and the right ectopic  pregnancy.   ESTIMATED BLOOD LOSS:  50 mL in addition to the 2 L in the abdomen.   COMPLICATIONS:  None.   SPECIMENS:  Right tube to pathology.   REASON FOR PROCEDURE:  The patient is a 40 year old gravida 3, para 2, who  had a known IUP, who had the sudden onset of abdominal pain and diaphoresis  today and was brought in the EMS and found to have a heterotopic pregnancy  with a right ruptured ectopic, at which point she was unstable and taken to  the OR.   DESCRIPTION OF PROCEDURE:  The patient was went to the OR, where she was  prepped and draped in the usual sterile fashion.  A knife was used to make a  Pfannenstiel incision and carried down to the underlying fascia.  The fascia  was then incised sharply and elevated off the superior and inferior edges of  the rectus muscle with Kocher clamps.  The rectus was dissected off bluntly  laterally and sharply in the midline.  The peritoneal cavity was then  entered, where a large amount of blood was noted.  There were large clots  coming out of the incision.  The uterus was then brought out and  the right  tube identified.  A Kelly clamp was placed across it and scissors used to  remove it.  Two suture ligations were used to fix the tube.  When the clamp  was removed, good hemostasis was noted.  The rest of the procedure involved  trying to remove the rest of the clot from the abdominal cavity as well as  copious irrigation.  Once as much blood could be removed as possible, the  fascia was closed with a #1 Vicryl suture  in a running fashion.  The subcutaneous tissue was then irrigated, any  bleeders cauterized with electrocautery, and the skin closed using clips.  All instrument, needle, and lap counts were correct x2.  The patient was  awakened, taken to the recovery room in stable condition.                                               Shelbie Proctor. Shawnie Pons, M.D.    TSP/MEDQ  D:  08/12/2003  [name]:  08/12/2003  Job:  [id number]

## 2010-11-02 NOTE — Consult Note (Signed)
NAME:  [name] [name], [name] [name]                              ACCOUNT NO.:  [id number]   MEDICAL RECORD NO.:  [id number]                   PATIENT TYPE:  OIB   LOCATION:  3715                                 FACILITY:  [facility]   PHYSICIAN:  Genene Churn. Love, MD                   DATE OF BIRTH:  Mar 26, 1971   DATE OF CONSULTATION:  03/18/2002  DATE OF DISCHARGE:                                   CONSULTATION   REASON FOR CONSULTATION:  This 40 year old, right-hand, married female who  was originally from the [place], [place], is seen at the request of Dr.  Duke Salvia for evaluation of right leg pain.   HISTORY OF PRESENT ILLNESS:  The patient has a history of supraventricular  tachycardia associated with syncope and was admitted on 03/17/2002 for  ablation procedure.  The procedure went well.  This morning she awoke  complaining of right leg pain, and I was asked to see her.  She has a known  prior history of lower back pain occurring in the second trimester of her  second pregnancy but not radiating into her leg.  Following the delivery of  her second child eight months ago, she has continued to have some lower  back, but it seemed to resolve until this past week she noted some right-  sided lower back pain and possibly some pain into her right leg.  She has  noted that this seems to be aggravated by activity and is made better by  lying down.  There are no definite bowel or bladder symptoms with it, left  leg symptoms, or definite tingling, though she has had some discomfort  occurring along the sole of her right foot. She denies any history of injury  and has not had an evaluation for this pain.  She feels that she has had  some difficulty with fatigue, having two children, of whom the 19-month-old  is very active.   PAST MEDICAL HISTORY:  Significant for supraventricular tachycardia but no  history of diabetes, thyroid disease, head or neck trauma.  She is currently  breast feeding her  baby.   PHYSICAL EXAMINATION:  GENERAL:  Well-developed white female with catheter  in place.  VITAL SIGNS:  Blood pressure right and left arm 95/60 and 90/60.  Heart rate  was 76.  There were no bruits.  NEUROLOGIC:  Mental status:  She was alert and oriented x 3.  Cranial nerve  examination revealed visual fields full, disks flat.  Extraocular movements  full, corneal's present.  Facial sensation equal.  No facial motor  asymmetry.  Hearing present.  Air conduction greater than bone conduction.  Tongue midline, uvula midline.  Gags present.  Sternocleidomastoid and  trapezius testing normal.  Motor examination revealed 5/5 strength  proximally and distally in upper and lower extremities including EHL,  anterior tibialis, posterior tibialis, peronaei,  gastrocnemius, hamstrings,  iliopsoas, quadriceps, femoris, gluteus maximum, and gluteus medius muscle  groups bilaterally.  Sensory examination was intact to pinprick, touch,  change of position, or vibration.  Pulses were intact in the right leg.  There was no lower extremity sensory loss.  Plantar responses were  downgoing.  Gait was not tested.  Her pain has essentially resolved.   IMPRESSION:  1. Right leg pain (Code 729.5).  2. History of lower back pain (Code 724.4).  3. Suspect right lumbar radiculopathy (Code 353.4).  4. Supraventricular tachycardia status post ablation procedure (Code 785.0).    PLAN:  At this time, obtain a lumbar spine x-ray but not place her on any  medications due to the fact she is breast feeding her infant.                                               Genene Churn. Love, MD    JML/MEDQ  D:  03/18/2002  T:  03/22/2002  Job:  [id number]   cc:   Duke Salvia, M.D. [facility]

## 2010-11-02 NOTE — Discharge Summary (Signed)
NAME:  [name] [name], [name] [name]                              ACCOUNT NO.:  [id number]   MEDICAL RECORD NO.:  [id number]                   PATIENT TYPE:  OIB   LOCATION:  3715                                 FACILITY:  [facility]   PHYSICIAN:  Duke Salvia, M.D. [facility]           DATE OF BIRTH:  [birth year]   DATE OF ADMISSION:  03/17/2002  DATE OF DISCHARGE:                                 DISCHARGE SUMMARY   HOSPITAL COURSE:  This is a 40 year old female with a history of documented  supraventricular tachycardia who was admitted for an electrophysiology study  and an ablation.  The patient underwent a successful slow pathway  modification of AVNRP.  The following day, she complained of some mild  shortness of breath with no chest pain.  She was afebrile.  Her blood  pressure was 90/60.  She went for a chest x-ray which showed no active  disease.  A 2-D echocardiogram was performed; the results are pending at  time of dictation.  The patient also is complaining of some right leg pain  which occurred two weeks ago when lifting her baby.  The patient has shown a  3/5 motor strength with toe dorsiflexion.  She had a consult placed to  neurology.  Dr. Genene Churn. Love came and saw the patient.  His impression was  that the right leg pain may be a radiculopathy, no medical Rx at this time,  and a lumbar spine x-ray was performed.  The preliminary readings of a  lumbar spine x-ray were read as normal by the radiologist.   DISCHARGE MEDICATIONS:  The patient was to be discharged to home on:  1. Coated aspirin 325 mg a day for six weeks.  2. Toprol dose was to be decreased at 25 mg daily for one week and then she     was to stop.  3. She was to have antibiotics prior to any dental, urine evaluation or GYN     procedures for the next three months and she was to call our office for     this prescription if needed.  4. A prescription was given for Tylenol No. 3 one to two tablets every four     to six hours as  needed for pain.   ACTIVITY:  She is not to do any heavy lifting or strength activity for four  days and no driving for two.   DIET:  Low-fat, low-cholesterol diet.   SPECIAL DISCHARGE INSTRUCTIONS:  She is to call if she develops any drainage  or lump in her groin.   FOLLOWUP:  An appointment was scheduled to follow up with myself, Chinita Pester, C.R.N.P., on October 15th at 2 p.m. for evaluation of EKG and the  patient was instructed to follow up with Dr. Sandria Manly as an outpatient and she  was provided with his phone number, [phone].   DISPOSITION:  The patient's  discharge is pending the results of her 2-D  echocardiogram, which will be read later today; if normal, the patient will  be discharged.     Chinita Pester, C.R.N.P. [facility]                 Duke Salvia, M.D. [facility]    DS/MEDQ  D:  03/18/2002  T:  03/23/2002  Job:  [id number]

## 2010-11-02 NOTE — Op Note (Signed)
NAME:  ABUKHALAF, Dominica T                       ACCOUNT NO.:  [id number]   MEDICAL RECORD NO.:  [id number]                   PATIENT TYPE:  OIB   LOCATION:  2550                                 FACILITY:  [facility]   PHYSICIAN:  Mark C. Ophelia Charter, M.D.                 DATE OF BIRTH:  [birth year]   DATE OF PROCEDURE:  03/14/2003  DATE OF DISCHARGE:                                 OPERATIVE REPORT   PREOPERATIVE DIAGNOSIS:  Right L4-5 herniated nucleus pulposus.   POSTOPERATIVE DIAGNOSIS:  Right L4-5 herniated nucleus pulposus.   OPERATION PERFORMED:  L4-5 microdiskectomy, removal of large free fragment.   SURGEON:  Mark C. Ophelia Charter, M.D.   ASSISTANT:  Genene Churn. Denton Meek.   ANESTHESIA:  General orotracheal anesthesia.   ESTIMATED BLOOD LOSS:  .   DESCRIPTION OF PROCEDURE:  After induction of general anesthesia, the  patient was placed in the kneeling position on an [place] frame and she  received preoperative antibiotics.  Back was prepped with DuraPrep.  The  area was squared with towels.  Betadine Vi-drape applied and laminectomy  sheets and drapes.  Incision was made after needle localization with a  spinal needle at the L4-5 interspace.  Incision was made a few millimeters  to the right of the midline.  Subperiosteal dissection on the lamina.  Taylor retractor was placed laterally initially with a 2 pound weight and  then converted to a 5 pound weight.  Inferior aspect of the lamina was  cleaned of soft tissue with cup curet.  Laminotomy was performed.  This was  enlarged taking about half the lamina on the right due to the extremely  large free fragment.  Bone was removed laterally out to the pedicle.  Nerve  root was dorsally displaced, extremely tight and extremely difficult to  mobilize.  Microdissection was performed in the gutter with the operating  microscope draped and brought in.  Disk was visualized and a small stab was  made in the disk.  Passes were made with  micropituitary to the level of the  disk and then nerve hook was used to gently release the scar tissue holding  the nerve root laterally and distally.  Once this was performed the shoulder  of the nerve root was gently mobilized and a small piece of the disk was  visualized.  This was grasped with micropituitary and second micropituitary  was used to gradually remove a large fragment of disk which was 2 x 3 x 2  cm.  There were some remaining fragments.  Passes were made in the disk with  minimal disk material remaining in the nucleus.  There was some remaining  small fragments that were removed.  The nerve root was completely freed and  the space anterior to the dura was completely decompressed.  Palpation was  made well past the midline with the hockey stick to make sure there were no  fragments toward the left side as well as distal and proximal.  After  irrigation with saline solution, fascia closed with 0 Vicryl and 2-0 Vicryl  in the subcutaneous tissue, 4-0 Vicryl subcuticular skin closure, Marcaine  infiltration of the skin, tincture of benzoin and Steri-Strips, 4 x 4s, and  tape.  Instrument and needle counts were correct.                                                Mark C. Ophelia Charter, M.D.   MCY/MEDQ  D:  03/14/2003  T:  03/14/2003  Job:  [id number]

## 2010-11-02 NOTE — Discharge Summary (Signed)
NAME:  Shackleton, Dominica T                            ACCOUNT NO.:  [id number]   MEDICAL RECORD NO.:  [id number]                   PATIENT TYPE:  INP   LOCATION:  9305                                 [facility]   PHYSICIAN:  Conni Elliot, M.D.             DATE OF BIRTH:  Jun 11, 1971   DATE OF ADMISSION:  08/12/2003  DATE OF DISCHARGE:  08/16/2003                                 DISCHARGE SUMMARY   HISTORY OF PRESENT ILLNESS:  A 40 year old gravida 3, para 2-0-0-2, who was  brought to the [facility] by EMS.  The patient was diaphoretic and  hypotensive and tachycardic.  It was determined at that time that she had a  ruptured ectopic and was taken to the operating room emergently.   The patient turned out to have a heterotopic pregnancy with an intrauterine  viable pregnancy as well as a heterotopic right ectopic.  The patient had an  exploratory laparotomy and right salpingectomy.  The patient had a normal  postoperative course and was felt to be ready for discharge on August 16, 2003.  The patient is to have follow-up through Dr. Shawnie Pons at [facility].                                               Conni Elliot, M.D.    ASG/MEDQ  D:  09/15/2003  T:  09/15/2003  Job:  [id number]

## 2010-11-02 NOTE — Consult Note (Signed)
[place]  Patient:    [name] [name] Visit Number: [id number] MRN: [id number]          Service Type: EMS Location: [facility] [place] Attending Physician:  Cathren Laine Dictated by:   Madolyn Frieze. Jens Som, M.D. [facility] Proc. Date: 12/23/01 Admit Date:  12/23/2001 Discharge Date: 12/23/2001                            Consultation Report  REASON FOR CONSULTATION:  The patient is a 40 year old female with a past medical history of supraventricular tachycardia who we were asked to evaluate for shortness of breath.  The patient has been followed by Dr. Graciela Husbands for SVT and has been treated with Toprol.  She has not had any recent palpitations. She does state that for the past 3 days she has had significant dyspnea.  This typically occurs with exertion but also increases with lying flat and there has been mild pedal edema by her report.  She also has had pain in her left lower extremity.  There has also been vague chest pain that radiates to the back and increases with inspiration.  She also complains of significant fatigue.  Because of these symptoms, she presented to the emergency room and we were asked to further evaluate.  ALLERGIES:  She has no known drug allergies.  PRESENT MEDICATIONS:  Her present medications include Toprol 50 mg two daily and a prenatal vitamin.  PAST MEDICAL HISTORY:  There is no diabetes mellitus, hypertension, hyperlipidemia.  She is status post recent delivery 5 months ago of a child. She does have the history of supraventricular tachycardia.  SOCIAL HISTORY:  She does not smoke or consume alcohol.  FAMILY HISTORY:  Negative for coronary artery disease.  REVIEW OF SYSTEMS:  She denies any headaches or fevers or chills.  There is no productive cough or hemoptysis.  There is no dysphagia, odynophagia, melena, or hematochezia.  There is no dysuria or hematuria.  There is no rash or seizure activity.  There is orthopnea and mild pedal edema  by report.  Of note, she has had not travelled recently (they do travel to [place] once a week).  PHYSICAL EXAMINATION:  VITAL SIGNS:  Her physical exam today shows a blood pressure of 107/69 and her pulse is 78.  She is afebrile.  GENERAL:  She is well developed and well nourished in no acute distress.  SKIN:  Her skin is warm and dry.  HEENT:  Her HEENT is unremarkable with no abnormalities.  NECK:  Her neck is supple and within normal limits bilaterally and there are no bruits noted.  There is no jugular venous distention, no thyromegaly noted.  CHEST:  Her chest clear to auscultation on expansion.  CARDIOVASCULAR:  Regular rate and rhythm with normal S1 and S2.  There are no murmurs, rubs, or gallops noted.  ABDOMEN:  Nontender, nondistended, positive bowel sounds; no hepatosplenomegaly, no masses appreciated.  There is no abdominal bruit.  She has 2+ femoral pulses bilaterally and no bruits.  EXTREMITIES:  Her extremities show no edema and I can palpate no cords.  She has a negative Homans bilaterally.  She has 2+ dorsalis pedis pulses bilaterally.  NEUROLOGICAL:  Grossly intact.  ELECTROCARDIOGRAM:  Sinus bradycardia rate of 55.  The axis is normal.  There are no acute ST changes.  ABG:  pH of 7.45 with a pCO2 of 36 and a pO2 of 109 with a bicarb of  25. Saturation is 98%.  CHEST X-RAY:  No acute disease.  CBC:  Her white blood cell count is 8.9 with a hemoglobin of 11.6, hematocrit of 34.6.  Her platelet count is 302.  ECHOCARDIOGRAM:  A quick look at echocardiogram for wall motion in the emergency room showed normal LV function, normal wall motion, normal right heart and no pericardial effusion.  DIAGNOSES: 1. Shortness of breath. 2. History of supraventricular tachycardia. 3. History of syncope associated with #2.  PLAN:  The patient presents for evaluation of shortness of breath.  The etiology of this is not clear to me.  She is complaining of a  pleuritic chest pain and also pain in her left lower extremity.  We will check a D-dimer as well as a CT of her chest to exclude pulmonary embolus.  We will also check a BNP.  Of note, her echocardiogram shows normal LV function with a normal right side and no effusion.  If her CT, BNP, and D-dimer are negative then I think she can be discharged and follow up with Dr. Graciela Husbands as an outpatient. Dictated by:   Madolyn Frieze. Jens Som, M.D. [facility] Attending Physician:  Cathren Laine DD:  12/23/01 TD:  12/27/01 Job: [id number]

## 2010-11-02 NOTE — H&P (Signed)
NAME:  [name] [name], [name] [name]                            ACCOUNT NO.:  [id number]   MEDICAL RECORD NO.:  [id number]                   PATIENT TYPE:  MAT   LOCATION:  MATC                                 FACILITY:  [facility]   PHYSICIAN:  Tanya S. Shawnie Pons, M.D.                DATE OF BIRTH:  1971-01-24   DATE OF ADMISSION:  08/12/2003  DATE OF DISCHARGE:                                HISTORY & PHYSICAL   BRIEF HISTORY:  Patient is a 40 year old gravida 3, para 2-0-0-2 who was  brought in by EMS to the [facility] at which point she was found to be diaphoretic,  hypotensive, and tachycardic.  She had a bedside ultrasound within 10  minutes of arrival which revealed large amount of blood in the abdomen,  definite IUP at 8 weeks and atopic at 8 weeks as well.  There was concern  that this might have been cornual ectopic.  Her blood pressure was down to  60/31 and decision was made to take her to the OR.   PAST MEDICAL HISTORY:  Negative.   PAST SURGICAL HISTORY:  She had a history of palpitations with an ablation  and she had back surgery in 2004.   MEDICATIONS:  None.   ALLERGIES:  No known drug allergies.   OBSTETRICAL HISTORY:  SVD x2.   GYNECOLOGIC HISTORY:  Negative.   SOCIAL HISTORY:  Denies tobacco, alcohol, or drug use.  She is from [place].   FAMILY HISTORY:  Unknown.   PHYSICAL EXAMINATION:  Her pulse is 120, blood pressure is 86/45,  respiratory rate is 24, O2 saturations are 97% on 8 L.  She is a well-  developed, well-nourished extremely pale [place] female.  She is in acute  distress.  She is diaphoretic.  Her mucous membranes and nail beds are  extremely pale.  She is diaphoretic.  Her abdomen is soft, it is exquisitely  tender.  Extremities show no cyanosis, clubbing, or edema.  The rest of the  exam is deferred secondary to emergent need to go to the OR.   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   PLAN:  Emergency exploratory laparotomy.                                       Shelbie Proctor. Shawnie Pons, M.D.    TSP/MEDQ  D:  08/12/2003  [name]:  08/12/2003  Job:  [id number]

## 2011-09-20 ENCOUNTER — Other Ambulatory Visit: Payer: Self-pay | Admitting: Obstetrics & Gynecology

## 2011-09-20 DIAGNOSIS — Z1231 Encounter for screening mammogram for malignant neoplasm of breast: Secondary | ICD-10-CM

## 2011-10-14 ENCOUNTER — Ambulatory Visit: Payer: Self-pay | Admitting: Physician Assistant

## 2011-10-24 ENCOUNTER — Ambulatory Visit (INDEPENDENT_AMBULATORY_CARE_PROVIDER_SITE_OTHER): Payer: Self-pay | Admitting: Physician Assistant

## 2011-10-24 ENCOUNTER — Encounter: Payer: Self-pay | Admitting: Physician Assistant

## 2011-10-24 ENCOUNTER — Ambulatory Visit (HOSPITAL_COMMUNITY): Payer: Self-pay

## 2011-10-24 VITALS — BP 97/69 | HR 84 | Temp 97.4°F | Ht 65.5 in | Wt 162.1 lb

## 2011-10-24 DIAGNOSIS — Z30431 Encounter for routine checking of intrauterine contraceptive device: Secondary | ICD-10-CM

## 2011-10-24 NOTE — Patient Instructions (Signed)
Mrs. Kendrix,  Thank you for coming in today. You IUD is in the proper place. There has been no recall for IUD removal.  If you pain worsens and you develop discharge please return to be rechecked.   Please find a primary doctor to check your cholesterol and blood sugar.   Dr. Armen Pickup

## 2011-10-24 NOTE — Assessment & Plan Note (Signed)
IUD strings visualized. Patient instructed to return of pain worsens or she develops vaginal discharge. Informed patient that there was no recall on IUDs. Advised patient to obtain PCP for well woman exam/physical.

## 2011-10-24 NOTE — Progress Notes (Signed)
Subjective:     Patient ID: [name] [name], female   DOB: 11-07-1970, 41 y.o.   MRN: [id number]  HPI 41 yo F presents for IUD string checked. IUD placed on 09/28/10. This is her second IUD. Since it has been placed she reports abdominal and back pain with small monthly periods. She denies vaginal discharge or fever. She also heard that there was a recall on IUDs.    Of note she also has concerns regarding increased weight, appetite, cravings for sweets, and her history of elevated cholesterol. She does not have a PCP.   Review of Systems As per HPI    Objective:   Physical Exam BP 97/69  Pulse 84  Temp(Src) 97.4 F (36.3 C) (Oral)  Ht 5' 5.5" (1.664 m)  Wt 162 lb 1.6 oz (73.528 kg)  BMI 26.56 kg/m2 General appearance: alert, cooperative and no distress Pelvic: cervix normal in appearance, external genitalia normal and IUD strings visualized.     Assessment and Plan:  IUD string check: IUD strings visualized. Patient instructed to return of pain worsens or she develops vaginal discharge. Informed patient that there was no recall on IUDs. Advised patient to obtain PCP for well woman exam/physical.

## 2011-11-08 ENCOUNTER — Ambulatory Visit (HOSPITAL_COMMUNITY)
Admission: RE | Admit: 2011-11-08 | Discharge: 2011-11-08 | Disposition: A | Discharge status: Home / Self Care | Payer: Medicaid Other | Source: Ambulatory Visit | Attending: Obstetrics & Gynecology | Admitting: Obstetrics & Gynecology

## 2011-11-08 DIAGNOSIS — Z1231 Encounter for screening mammogram for malignant neoplasm of breast: Secondary | ICD-10-CM | POA: Insufficient documentation

## 2012-09-22 ENCOUNTER — Ambulatory Visit: Payer: Medicaid Other

## 2012-09-24 ENCOUNTER — Ambulatory Visit: Payer: Medicaid Other | Attending: Neurosurgery | Admitting: Physical Therapy

## 2012-09-24 DIAGNOSIS — M545 Low back pain, unspecified: Secondary | ICD-10-CM | POA: Insufficient documentation

## 2012-09-24 DIAGNOSIS — R293 Abnormal posture: Secondary | ICD-10-CM | POA: Insufficient documentation

## 2012-09-24 DIAGNOSIS — IMO0001 Reserved for inherently not codable concepts without codable children: Secondary | ICD-10-CM | POA: Insufficient documentation

## 2012-09-24 DIAGNOSIS — M2550 Pain in unspecified joint: Secondary | ICD-10-CM | POA: Insufficient documentation

## 2012-09-24 DIAGNOSIS — M542 Cervicalgia: Secondary | ICD-10-CM | POA: Insufficient documentation

## 2012-10-02 ENCOUNTER — Other Ambulatory Visit: Payer: Self-pay | Admitting: Neurosurgery

## 2012-10-02 DIAGNOSIS — M542 Cervicalgia: Secondary | ICD-10-CM

## 2012-10-02 DIAGNOSIS — M549 Dorsalgia, unspecified: Secondary | ICD-10-CM

## 2012-10-08 ENCOUNTER — Ambulatory Visit: Payer: Medicaid Other | Admitting: Physical Therapy

## 2012-10-14 ENCOUNTER — Ambulatory Visit
Admission: RE | Admit: 2012-10-14 | Discharge: 2012-10-14 | Disposition: A | Discharge status: Home / Self Care | Payer: Medicaid Other | Source: Ambulatory Visit | Attending: Neurosurgery | Admitting: Neurosurgery

## 2012-10-14 ENCOUNTER — Other Ambulatory Visit: Payer: Self-pay | Admitting: Neurosurgery

## 2012-10-14 DIAGNOSIS — M549 Dorsalgia, unspecified: Secondary | ICD-10-CM

## 2012-10-14 DIAGNOSIS — M542 Cervicalgia: Secondary | ICD-10-CM

## 2012-10-15 ENCOUNTER — Ambulatory Visit: Payer: Medicaid Other | Admitting: Physical Therapy

## 2012-10-22 ENCOUNTER — Ambulatory Visit: Payer: Medicaid Other | Attending: Neurosurgery | Admitting: Physical Therapy

## 2012-10-22 DIAGNOSIS — M545 Low back pain, unspecified: Secondary | ICD-10-CM | POA: Insufficient documentation

## 2012-10-22 DIAGNOSIS — M2550 Pain in unspecified joint: Secondary | ICD-10-CM | POA: Insufficient documentation

## 2012-10-22 DIAGNOSIS — M542 Cervicalgia: Secondary | ICD-10-CM | POA: Insufficient documentation

## 2012-10-22 DIAGNOSIS — R293 Abnormal posture: Secondary | ICD-10-CM | POA: Insufficient documentation

## 2012-10-22 DIAGNOSIS — IMO0001 Reserved for inherently not codable concepts without codable children: Secondary | ICD-10-CM | POA: Insufficient documentation

## 2013-03-02 ENCOUNTER — Other Ambulatory Visit (HOSPITAL_COMMUNITY): Payer: Self-pay | Admitting: Internal Medicine

## 2013-03-02 DIAGNOSIS — Z1231 Encounter for screening mammogram for malignant neoplasm of breast: Secondary | ICD-10-CM

## 2013-03-05 ENCOUNTER — Ambulatory Visit (HOSPITAL_COMMUNITY)
Admission: RE | Admit: 2013-03-05 | Discharge: 2013-03-05 | Disposition: A | Discharge status: Home / Self Care | Payer: Medicaid Other | Source: Ambulatory Visit | Attending: Internal Medicine | Admitting: Internal Medicine

## 2013-03-05 DIAGNOSIS — Z1231 Encounter for screening mammogram for malignant neoplasm of breast: Secondary | ICD-10-CM | POA: Insufficient documentation

## 2013-04-01 ENCOUNTER — Telehealth: Payer: Self-pay | Admitting: *Deleted

## 2013-04-01 NOTE — Telephone Encounter (Signed)
Left message for pt to return my call so I can schedule a genetic appt.  

## 2013-04-05 ENCOUNTER — Telehealth: Payer: Self-pay | Admitting: *Deleted

## 2013-04-05 NOTE — Telephone Encounter (Signed)
Pt called and I confirmed 04/26/13 genetic appt w/ pt.

## 2013-04-26 ENCOUNTER — Other Ambulatory Visit: Payer: Medicaid Other | Admitting: Lab

## 2013-04-26 ENCOUNTER — Ambulatory Visit (HOSPITAL_BASED_OUTPATIENT_CLINIC_OR_DEPARTMENT_OTHER): Payer: Medicaid Other | Admitting: Genetic Counselor

## 2013-04-26 ENCOUNTER — Encounter: Payer: Self-pay | Admitting: Genetic Counselor

## 2013-04-26 DIAGNOSIS — Z803 Family history of malignant neoplasm of breast: Secondary | ICD-10-CM

## 2013-04-26 DIAGNOSIS — IMO0002 Reserved for concepts with insufficient information to code with codable children: Secondary | ICD-10-CM

## 2013-04-26 NOTE — Progress Notes (Signed)
Dr.  Lovell Sheehan requested a consultation for genetic counseling and risk assessment for [name] [name], a 42 y.o. female, for discussion of her family history of breast cancer.  She presents to clinic today to discuss the possibility of a genetic predisposition to cancer, and to further clarify her risks, as well as her family members' risks for cancer.   HISTORY OF PRESENT ILLNESS: [name] T Pazos is a 42 y.o. female with no personal history of cancer.  She had a recent mammogram where she was found to have dense breasts and was told by the radiologist that she had a 40% lifetime risk for breast cancer.  No past medical history on file.  Past Surgical History  Procedure Laterality Date  . Back surgery      History   Social History  . Marital Status: Married    Spouse Name: N/A    Number of Children: N/A  . Years of Education: N/A   Social History Main Topics  . Smoking status: Never Smoker   . Smokeless tobacco: Never Used  . Alcohol Use: No  . Drug Use: No  . Sexual Activity: Yes    Birth Control/ Protection: IUD   Other Topics Concern  . Not on file   Social History Narrative  . No narrative on file    REPRODUCTIVE HISTORY AND PERSONAL RISK ASSESSMENT FACTORS: Menarche was at age 21.   premenopausal Uterus Intact: yes Ovaries Intact: yes [id number](ectopic pregnancy co-occurring with a uterine pregnancy), first live birth at age 25  She has not previously undergone treatment for infertility.   Oral Contraceptive use: 0 years   She has not used HRT in the past.    FAMILY HISTORY:  We obtained a detailed, 4-generation family history.  Significant diagnoses are listed below: Family History  Problem Relation Age of Onset  . Diabetes Mother   . Breast cancer Mother 67  . Heart disease Father   . Leukemia Maternal Uncle   . Bone cancer Maternal Uncle    Patient's maternal ancestors are of [place] descent, and paternal ancestors are of [place] descent. There is no  reported Ashkenazi Jewish ancestry. There is no known consanguinity.  GENETIC COUNSELING ASSESSMENT: [name] T Hofbauer is a 42 y.o. female with a family history of early onset breast cancer which somewhat suggestive of a hereditary cancer syndrome and predisposition to cancer. We, therefore, discussed and recommended the following at today's visit.   DISCUSSION: We reviewed the characteristics, features and inheritance patterns of hereditary cancer syndromes. We also discussed genetic testing, including the appropriate family members to test, the process of testing, insurance coverage and turn-around-time for results. There was a little difficulty with communication as Ms. Wacker's first language was not English so she was unfamiliar with certain terms (e.g. Uterus).  Ms. Loh indicated that her sister had a mammogram and was found to have something in her lymph nodes, but she was unclear what it was.  We discussed that since her mother is still living the most informative person to test is her mother; however, with the exception of a younger brother, Ms. Kamphuis entire family is living in the middle [place]. She feels that they could have her mother tested for multiple genes in [place], so I mentioned that they consider a breast cancer panel on her mother.  Based on Ms. Durr's Medicaid, she is eligible for testing through [facility] for BRCA mutations.  In order to estimate her chance of having a BRCA mutation, we used  statistical models (Penn II) and laboratory data that take into account her personal medical history, family history and ancestry.  Because each model is different, there can be a lot of variability in the risks they give.  Therefore, these numbers must be considered a rough range and not a precise risk of having a BRCA mutation.  This model estimates that she has approximately a 5% chance of having a mutation. Based on this assessment of her family and personal history, genetic testing is recommended, based  on her mother living in a different country and it is difficult to obtain testing for her.  PLAN: After considering the risks, benefits, and limitations, [name] T Mooneyham provided informed consent to pursue genetic testing and the blood sample will be sent to [facility] for analysis of the [facility] testing. We discussed the implications of a positive, negative and/ or variant of uncertain significance genetic test result. Results should be available within approximately 3-6 weeks' time, at which point they will be disclosed by telephone to [name] T Mickelsen, as will any additional recommendations warranted by these results. [name] ADDALIE CALLES will receive a summary of her genetic counseling visit and a copy of her results once available. This information will also be available in Epic. We encouraged [name] T Lacross to remain in contact with cancer genetics annually so that we can continuously update the family history and inform her of any changes in cancer genetics and testing that may be of benefit for her family. [name] T Hargreaves's questions were answered to her satisfaction today. Our contact information was provided should additional questions or concerns arise.  The patient was seen for a total of 60 minutes, greater than 50% of which was spent face-to-face counseling.  This note will also be sent to the referring provider via the electronic medical record. The patient will be supplied with a summary of this genetic counseling discussion as well as educational information on the discussed hereditary cancer syndromes following the conclusion of their visit.   Patient was discussed with Dr. Drue Second.   _______________________________________________________________________ For Office Staff:  Number of people involved in session: 1 Was an Intern/ student involved with case: no

## 2013-05-17 ENCOUNTER — Telehealth: Payer: Self-pay | Admitting: Genetic Counselor

## 2013-05-17 NOTE — Telephone Encounter (Signed)
Revealed negative genetic testing. Discussed that if she gets Nurse, learning disability in the future, or if she loses her Medicaid and meets the financial criteria for genetic testing through [facility], we could offer additional testing for other candidate genes.  She will let us know if either of these things occur.

## 2013-05-17 NOTE — Telephone Encounter (Signed)
Left good news message on machine and asked that she call back. 

## 2013-05-18 ENCOUNTER — Encounter: Payer: Self-pay | Admitting: Genetic Counselor

## 2013-10-11 ENCOUNTER — Ambulatory Visit: Payer: Medicaid Other | Admitting: Nurse Practitioner

## 2013-10-11 ENCOUNTER — Telehealth: Payer: Self-pay | Admitting: *Deleted

## 2013-10-11 NOTE — Telephone Encounter (Signed)
Called [name] [name]'s number , left message with female that she had missed appointment, can call and reschedule if desires.

## 2014-01-13 ENCOUNTER — Other Ambulatory Visit (HOSPITAL_COMMUNITY): Payer: Self-pay | Admitting: Family Medicine

## 2014-01-13 DIAGNOSIS — Z1231 Encounter for screening mammogram for malignant neoplasm of breast: Secondary | ICD-10-CM

## 2014-03-08 ENCOUNTER — Ambulatory Visit (HOSPITAL_COMMUNITY)
Admission: RE | Admit: 2014-03-08 | Discharge: 2014-03-08 | Disposition: A | Discharge status: Home / Self Care | Payer: Medicaid Other | Source: Ambulatory Visit | Attending: Family Medicine | Admitting: Family Medicine

## 2014-03-08 DIAGNOSIS — Z1231 Encounter for screening mammogram for malignant neoplasm of breast: Secondary | ICD-10-CM | POA: Diagnosis present

## 2014-04-18 ENCOUNTER — Encounter: Payer: Self-pay | Admitting: Genetic Counselor

## 2014-06-17 HISTORY — PX: BIOPSY BREAST: PRO8

## 2014-09-12 ENCOUNTER — Other Ambulatory Visit: Payer: Self-pay | Admitting: Family Medicine

## 2014-09-12 DIAGNOSIS — N63 Unspecified lump in unspecified breast: Secondary | ICD-10-CM

## 2014-09-15 ENCOUNTER — Other Ambulatory Visit: Payer: Self-pay | Admitting: Family Medicine

## 2014-09-15 ENCOUNTER — Ambulatory Visit
Admission: RE | Admit: 2014-09-15 | Discharge: 2014-09-15 | Disposition: A | Discharge status: Home / Self Care | Payer: Medicaid Other | Source: Ambulatory Visit | Attending: Family Medicine | Admitting: Family Medicine

## 2014-09-15 DIAGNOSIS — N63 Unspecified lump in unspecified breast: Secondary | ICD-10-CM

## 2014-09-19 ENCOUNTER — Other Ambulatory Visit: Payer: Self-pay | Admitting: Family Medicine

## 2014-09-19 DIAGNOSIS — N63 Unspecified lump in unspecified breast: Secondary | ICD-10-CM

## 2014-09-22 ENCOUNTER — Ambulatory Visit
Admission: RE | Admit: 2014-09-22 | Discharge: 2014-09-22 | Disposition: A | Discharge status: Home / Self Care | Payer: Medicaid Other | Source: Ambulatory Visit | Attending: Family Medicine | Admitting: Family Medicine

## 2014-09-22 ENCOUNTER — Other Ambulatory Visit (HOSPITAL_COMMUNITY): Payer: Self-pay | Admitting: Diagnostic Radiology

## 2014-09-22 DIAGNOSIS — N63 Unspecified lump in unspecified breast: Secondary | ICD-10-CM

## 2015-01-18 ENCOUNTER — Other Ambulatory Visit: Payer: Self-pay

## 2015-01-18 DIAGNOSIS — Z1231 Encounter for screening mammogram for malignant neoplasm of breast: Secondary | ICD-10-CM

## 2015-03-05 ENCOUNTER — Encounter (HOSPITAL_BASED_OUTPATIENT_CLINIC_OR_DEPARTMENT_OTHER): Payer: Self-pay | Admitting: *Deleted

## 2015-03-05 DIAGNOSIS — Z79899 Other long term (current) drug therapy: Secondary | ICD-10-CM | POA: Diagnosis not present

## 2015-03-05 DIAGNOSIS — R21 Rash and other nonspecific skin eruption: Secondary | ICD-10-CM | POA: Insufficient documentation

## 2015-03-05 NOTE — ED Notes (Signed)
Pt has several red, swollen areas on R foot and bil legs. Denies fever, n/v/d.

## 2015-03-06 ENCOUNTER — Emergency Department (HOSPITAL_BASED_OUTPATIENT_CLINIC_OR_DEPARTMENT_OTHER)
Admission: EM | Admit: 2015-03-06 | Discharge: 2015-03-06 | Disposition: A | Discharge status: Home / Self Care | Payer: Medicaid Other | Attending: Emergency Medicine | Admitting: Emergency Medicine

## 2015-03-06 DIAGNOSIS — R21 Rash and other nonspecific skin eruption: Secondary | ICD-10-CM

## 2015-03-06 MED ORDER — TRAMADOL HCL 50 MG PO TABS: 50.0000 mg | ORAL_TABLET | Freq: Four times a day (QID) | ORAL | Status: DC | PRN

## 2015-03-06 MED ORDER — HYDROCORTISONE 1 % EX CREA: TOPICAL_CREAM | CUTANEOUS | Status: DC

## 2015-03-06 MED ORDER — DIPHENHYDRAMINE HCL 25 MG PO TABS: 50.0000 mg | ORAL_TABLET | Freq: Three times a day (TID) | ORAL | Status: DC | PRN

## 2015-03-06 MED ORDER — PREDNISONE 50 MG PO TABS
60.0000 mg | ORAL_TABLET | Freq: Once | ORAL | Status: AC
  Administered 2015-03-06: 60 mg via ORAL
  Filled 2015-03-06 (×2): qty 1

## 2015-03-06 MED ORDER — PREDNISONE 20 MG PO TABS: 60.0000 mg | ORAL_TABLET | Freq: Every day | ORAL | Status: DC

## 2015-03-06 MED ORDER — DIPHENHYDRAMINE HCL 25 MG PO CAPS
50.0000 mg | ORAL_CAPSULE | Freq: Once | ORAL | Status: AC
  Administered 2015-03-06: 50 mg via ORAL
  Filled 2015-03-06: qty 2

## 2015-03-06 MED ORDER — TRAMADOL HCL 50 MG PO TABS
50.0000 mg | ORAL_TABLET | Freq: Once | ORAL | Status: AC
  Administered 2015-03-06: 50 mg via ORAL
  Filled 2015-03-06: qty 1

## 2015-03-06 NOTE — ED Notes (Signed)
Pt alert, NAD, calm, interactive, resp e/u, ambulatory with steady gait, family present. C/o scattered bites/rash, swelling, pain redness and itching. Walked in grass at night on Friday. Bites developed "yesterday", itching redness and pain progressively worse. No meds or topicals PTA. Pinpoints to areas of bilateral feet, L calf, R gluteal fold.

## 2015-03-06 NOTE — ED Notes (Signed)
Dr. Ward at [facility].

## 2015-03-06 NOTE — ED Provider Notes (Signed)
TIME SEEN: 12:20 AM  CHIEF COMPLAINT: Rash  HPI: Pt is a 44 y.o. female with no significant past history who presents to the emergency department with pruritic rash on her bilateral feet, left calf, right buttock. States that these occurred after being outside in the grass 2 days ago. She does not recall anything biting her. No new soaps, lotions, and surgeons or medications. No fever. No history of injury to this area.  ROS: See HPI Constitutional: no fever  Eyes: no drainage  ENT: no runny nose   Cardiovascular:  no chest pain  Resp: no SOB  GI: no vomiting GU: no dysuria Integumentary:  rash  Allergy: no hives  Musculoskeletal: no leg swelling  Neurological: no slurred speech ROS otherwise negative  PAST MEDICAL HISTORY/PAST SURGICAL HISTORY:  History reviewed. No pertinent past medical history.  MEDICATIONS:  Prior to Admission medications   Medication Sig Start Date End Date Taking? Authorizing Provider  diphenhydrAMINE (BENADRYL) 25 MG tablet Take 2 tablets (50 mg total) by mouth every 8 (eight) hours as needed for itching. 03/06/15   Kristen N Ward, DO  fish oil-omega-3 fatty acids 1000 MG capsule Take 2 g by mouth daily.    Historical Provider, MD  hydrocortisone cream 1 % Apply to affected area 2 times daily 03/06/15   Layla Maw Ward, DO  levonorgestrel (MIRENA) 20 MCG/24HR IUD 1 each by Intrauterine route once. 09/28/10   Historical Provider, MD  predniSONE (DELTASONE) 20 MG tablet Take 3 tablets (60 mg total) by mouth daily. 03/06/15   Kristen N Ward, DO  traMADol (ULTRAM) 50 MG tablet Take 1 tablet (50 mg total) by mouth every 6 (six) hours as needed. 03/06/15   Kristen N Ward, DO    ALLERGIES:  Allergies  Allergen Reactions  . Shellfish Allergy Itching    SOCIAL HISTORY:  Social History  Substance Use Topics  . Smoking status: Never Smoker   . Smokeless tobacco: Never Used  . Alcohol Use: No    FAMILY HISTORY: Family History  Problem Relation Age of Onset  .  Diabetes Mother   . Breast cancer Mother 13  . Heart disease Father   . Leukemia Maternal Uncle   . Bone cancer Maternal Uncle     EXAM: BP 106/74 mmHg  Pulse 77  Temp(Src) 98.5 F (36.9 C) (Oral)  Resp 20  Ht  (1.702 m)  Wt 165 lb (74.844 kg)  BMI 25.84 kg/m2  SpO2 97%  LMP 03/05/2015 (Exact Date) CONSTITUTIONAL: Alert and oriented and responds appropriately to questions. Well-appearing; well-nourished HEAD: Normocephalic EYES: Conjunctivae clear, PERRL ENT: normal nose; no rhinorrhea; moist mucous membranes; pharynx without lesions noted NECK: Supple, no meningismus, no LAD  CARD: RRR; S1 and S2 appreciated; no murmurs, no clicks, no rubs, no gallops RESP: Normal chest excursion without splinting or tachypnea; breath sounds clear and equal bilaterally; no wheezes, no rhonchi, no rales, no hypoxia or respiratory distress, speaking full sentences ABD/GI: Normal bowel sounds; non-distended; soft, non-tender, no rebound, no guarding, no peritoneal signs BACK:  The back appears normal and is non-tender to palpation, there is no CVA tenderness EXT: Normal ROM in all joints; non-tender to palpation; mild nonpitting edema in bilateral feet; normal capillary refill; no cyanosis, no calf tenderness or swelling    SKIN: Normal color for age and race; warm, small flat red papules with small vesicular lesions scattered to the bilateral feet, left calf and right buttock without drainage, induration, fluctuance or warmth; no blisters or desquamation,  no petechia or purpura, no bull's-eye rash, no rash involving the palms, soles or mucous membranes NEURO: Moves all extremities equally, sensation to light touch intact diffusely, cranial nerves II through XII intact PSYCH: The patient's mood and manner are appropriate. Grooming and personal hygiene are appropriate.  MEDICAL DECISION MAKING: Patient here with rash that looks like contact dermatitis versus insect bites. I do not think she has  cellulitis. No signs of life-threatening rash. Will place patient on steroid, Benadryl and hydrocortisone cream. Will provide her pain medication given she reports it is very painful to walk because of the swelling in her feet. I do not think she needs to be on antibiotics at this time. Have discussed return precautions and importance of PCP follow-up. She verbalized understanding and discomfort with this plan.      Layla Maw Ward, DO 03/06/15 [phone]

## 2015-03-06 NOTE — Discharge Instructions (Signed)
Contact Dermatitis °Contact dermatitis is a reaction to certain substances that touch the skin. Contact dermatitis can be either irritant contact dermatitis or allergic contact dermatitis. Irritant contact dermatitis does not require previous exposure to the substance for a reaction to occur. Allergic contact dermatitis only occurs if you have been exposed to the substance before. Upon a repeat exposure, your body reacts to the substance.  °CAUSES  °Many substances can cause contact dermatitis. Irritant dermatitis is most commonly caused by repeated exposure to mildly irritating substances, such as: °· Makeup. °· Soaps. °· Detergents. °· Bleaches. °· Acids. °· Metal salts, such as nickel. °Allergic contact dermatitis is most commonly caused by exposure to: °· Poisonous plants. °· Chemicals (deodorants, shampoos). °· Jewelry. °· Latex. °· Neomycin in triple antibiotic cream. °· Preservatives in products, including clothing. °SYMPTOMS  °The area of skin that is exposed may develop: °· Dryness or flaking. °· Redness. °· Cracks. °· Itching. °· Pain or a burning sensation. °· Blisters. °With allergic contact dermatitis, there may also be swelling in areas such as the eyelids, mouth, or genitals.  °DIAGNOSIS  °Your caregiver can usually tell what the problem is by doing a physical exam. In cases where the cause is uncertain and an allergic contact dermatitis is suspected, a patch skin test may be performed to help determine the cause of your dermatitis. °TREATMENT °Treatment includes protecting the skin from further contact with the irritating substance by avoiding that substance if possible. Barrier creams, powders, and gloves may be helpful. Your caregiver may also recommend: °· Steroid creams or ointments applied 2 times daily. For best results, soak the rash area in cool water for 20 minutes. Then apply the medicine. Cover the area with a plastic wrap. You can store the steroid cream in the refrigerator for a "chilly"  effect on your rash. That may decrease itching. Oral steroid medicines may be needed in more severe cases. °· Antibiotics or antibacterial ointments if a skin infection is present. °· Antihistamine lotion or an antihistamine taken by mouth to ease itching. °· Lubricants to keep moisture in your skin. °· Burow's solution to reduce redness and soreness or to dry a weeping rash. Mix one packet or tablet of solution in 2 cups cool water. Dip a clean washcloth in the mixture, wring it out a bit, and put it on the affected area. Leave the cloth in place for 30 minutes. Do this as often as possible throughout the day. °· Taking several cornstarch or baking soda baths daily if the area is too large to cover with a washcloth. °Harsh chemicals, such as alkalis or acids, can cause skin damage that is like a burn. You should flush your skin for 15 to 20 minutes with cold water after such an exposure. You should also seek immediate medical care after exposure. Bandages (dressings), antibiotics, and pain medicine may be needed for severely irritated skin.  °HOME CARE INSTRUCTIONS °· Avoid the substance that caused your reaction. °· Keep the area of skin that is affected away from hot water, soap, sunlight, chemicals, acidic substances, or anything else that would irritate your skin. °· Do not scratch the rash. Scratching may cause the rash to become infected. °· You may take cool baths to help stop the itching. °· Only take over-the-counter or prescription medicines as directed by your caregiver. °· See your caregiver for follow-up care as directed to make sure your skin is healing properly. °SEEK MEDICAL CARE IF:  °· Your condition is not better after 3   days of treatment. °· You seem to be getting worse. °· You see signs of infection such as swelling, tenderness, redness, soreness, or warmth in the affected area. °· You have any problems related to your medicines. °Document Released: 05/31/2000 Document Revised: 08/26/2011  Document Reviewed: 11/06/2010 °ExitCare® Patient Information ©2015 [employer]. This information is not intended to replace advice given to you by your health care provider. Make sure you discuss any questions you have with your health care provider. °Insect Bite °Mosquitoes, flies, fleas, bedbugs, and many other insects can bite. Insect bites are different from insect stings. A sting is when venom is injected into the skin. Some insect bites can transmit infectious diseases. °SYMPTOMS  °Insect bites usually turn red, swell, and itch for 2 to 4 days. They often go away on their own. °TREATMENT  °Your caregiver may prescribe antibiotic medicines if a bacterial infection develops in the bite. °HOME CARE INSTRUCTIONS °· Do not scratch the bite area. °· Keep the bite area clean and dry. Wash the bite area thoroughly with soap and water. °· Put ice or cool compresses on the bite area. °¨ Put ice in a plastic bag. °¨ Place a towel between your skin and the bag. °¨ Leave the ice on for 20 minutes, 4 times a day for the first 2 to 3 days, or as directed. °· You may apply a baking soda paste, cortisone cream, or calamine lotion to the bite area as directed by your caregiver. This can help reduce itching and swelling. °· Only take over-the-counter or prescription medicines as directed by your caregiver. °· If you are given antibiotics, take them as directed. Finish them even if you start to feel better. °You may need a tetanus shot if: °· You cannot remember when you had your last tetanus shot. °· You have never had a tetanus shot. °· The injury broke your skin. °If you get a tetanus shot, your arm may swell, get red, and feel warm to the touch. This is common and not a problem. If you need a tetanus shot and you choose not to have one, there is a rare chance of getting tetanus. Sickness from tetanus can be serious. °SEEK IMMEDIATE MEDICAL CARE IF:  °· You have increased pain, redness, or swelling in the bite area. °· You  see a red line on the skin coming from the bite. °· You have a fever. °· You have joint pain. °· You have a headache or neck pain. °· You have unusual weakness. °· You have a rash. °· You have chest pain or shortness of breath. °· You have abdominal pain, nausea, or vomiting. °· You feel unusually tired or sleepy. °MAKE SURE YOU:  °· Understand these instructions. °· Will watch your condition. °· Will get help right away if you are not doing well or get worse. °Document Released: 07/11/2004 Document Revised: 08/26/2011 Document Reviewed: 01/02/2011 °ExitCare® Patient Information ©2015 [employer]. This information is not intended to replace advice given to you by your health care provider. Make sure you discuss any questions you have with your health care provider. ° °

## 2015-03-22 ENCOUNTER — Ambulatory Visit
Admission: RE | Admit: 2015-03-22 | Discharge: 2015-03-22 | Disposition: A | Discharge status: Home / Self Care | Payer: Medicaid Other | Source: Ambulatory Visit

## 2015-03-22 DIAGNOSIS — Z1231 Encounter for screening mammogram for malignant neoplasm of breast: Secondary | ICD-10-CM

## 2016-02-15 ENCOUNTER — Other Ambulatory Visit: Payer: Self-pay | Admitting: Family Medicine

## 2016-02-15 DIAGNOSIS — Z1231 Encounter for screening mammogram for malignant neoplasm of breast: Secondary | ICD-10-CM

## 2016-03-26 ENCOUNTER — Ambulatory Visit
Admission: RE | Admit: 2016-03-26 | Discharge: 2016-03-26 | Disposition: A | Discharge status: Home / Self Care | Payer: Medicaid Other | Source: Ambulatory Visit | Attending: Family Medicine | Admitting: Family Medicine

## 2016-03-26 DIAGNOSIS — Z1231 Encounter for screening mammogram for malignant neoplasm of breast: Secondary | ICD-10-CM

## 2017-02-13 ENCOUNTER — Other Ambulatory Visit: Payer: Self-pay | Admitting: Family Medicine

## 2017-02-13 DIAGNOSIS — Z1231 Encounter for screening mammogram for malignant neoplasm of breast: Secondary | ICD-10-CM

## 2017-04-01 ENCOUNTER — Ambulatory Visit
Admission: RE | Admit: 2017-04-01 | Discharge: 2017-04-01 | Disposition: A | Discharge status: Home / Self Care | Payer: Medicaid Other | Source: Ambulatory Visit | Attending: Family Medicine | Admitting: Family Medicine

## 2017-04-01 DIAGNOSIS — Z1231 Encounter for screening mammogram for malignant neoplasm of breast: Secondary | ICD-10-CM

## 2017-10-22 ENCOUNTER — Other Ambulatory Visit: Payer: Self-pay | Admitting: Nurse Practitioner

## 2017-10-22 DIAGNOSIS — R928 Other abnormal and inconclusive findings on diagnostic imaging of breast: Secondary | ICD-10-CM

## 2017-11-03 ENCOUNTER — Ambulatory Visit
Admission: RE | Admit: 2017-11-03 | Discharge: 2017-11-03 | Disposition: A | Discharge status: Home / Self Care | Payer: Medicaid Other | Source: Ambulatory Visit | Attending: Nurse Practitioner | Admitting: Nurse Practitioner

## 2017-11-03 DIAGNOSIS — R928 Other abnormal and inconclusive findings on diagnostic imaging of breast: Secondary | ICD-10-CM

## 2017-11-06 ENCOUNTER — Other Ambulatory Visit: Payer: Self-pay | Admitting: Nurse Practitioner

## 2017-11-06 DIAGNOSIS — R928 Other abnormal and inconclusive findings on diagnostic imaging of breast: Secondary | ICD-10-CM

## 2017-11-06 DIAGNOSIS — Z9189 Other specified personal risk factors, not elsewhere classified: Secondary | ICD-10-CM

## 2017-11-17 ENCOUNTER — Inpatient Hospital Stay
Admission: RE | Admit: 2017-11-17 | Discharge: 2017-11-17 | Disposition: A | Discharge status: Home / Self Care | Payer: Self-pay | Source: Ambulatory Visit | Attending: Nurse Practitioner | Admitting: Nurse Practitioner

## 2017-11-17 ENCOUNTER — Other Ambulatory Visit: Payer: Self-pay | Admitting: Nurse Practitioner

## 2017-11-17 DIAGNOSIS — R928 Other abnormal and inconclusive findings on diagnostic imaging of breast: Secondary | ICD-10-CM

## 2017-11-25 ENCOUNTER — Ambulatory Visit
Admission: RE | Admit: 2017-11-25 | Discharge: 2017-11-25 | Disposition: A | Discharge status: Home / Self Care | Payer: Medicaid Other | Source: Ambulatory Visit | Attending: Nurse Practitioner | Admitting: Nurse Practitioner

## 2017-11-25 DIAGNOSIS — R928 Other abnormal and inconclusive findings on diagnostic imaging of breast: Secondary | ICD-10-CM

## 2017-11-25 DIAGNOSIS — Z9189 Other specified personal risk factors, not elsewhere classified: Secondary | ICD-10-CM

## 2017-11-25 MED ORDER — GADOBENATE DIMEGLUMINE 529 MG/ML IV SOLN
12.0000 mL | Freq: Once | INTRAVENOUS | Status: AC | PRN
Start: 2017-11-25 — End: 2017-11-25
  Administered 2017-11-25: 12 mL via INTRAVENOUS

## 2018-02-24 ENCOUNTER — Other Ambulatory Visit: Payer: Self-pay | Admitting: Nurse Practitioner

## 2018-02-24 DIAGNOSIS — N644 Mastodynia: Secondary | ICD-10-CM

## 2018-03-19 ENCOUNTER — Other Ambulatory Visit: Payer: Self-pay

## 2018-03-19 ENCOUNTER — Encounter (HOSPITAL_BASED_OUTPATIENT_CLINIC_OR_DEPARTMENT_OTHER): Payer: Self-pay | Admitting: *Deleted

## 2018-03-19 DIAGNOSIS — Z5321 Procedure and treatment not carried out due to patient leaving prior to being seen by health care provider: Secondary | ICD-10-CM | POA: Diagnosis not present

## 2018-03-19 DIAGNOSIS — M542 Cervicalgia: Secondary | ICD-10-CM | POA: Diagnosis present

## 2018-03-19 NOTE — ED Triage Notes (Signed)
Neck pain for a month. She has been referred to a neurologist by her MD and but she cannot be seen for several weeks. Her husband says she is having sob and right sided chest pain. She took Gabapentin x 3 tablets tonight for the pain.

## 2018-03-20 ENCOUNTER — Emergency Department (HOSPITAL_BASED_OUTPATIENT_CLINIC_OR_DEPARTMENT_OTHER)
Admission: EM | Admit: 2018-03-20 | Discharge: 2018-03-20 | Discharge status: Against Medical Advice | Payer: Medicaid Other | Attending: Emergency Medicine | Admitting: Emergency Medicine

## 2018-05-04 ENCOUNTER — Ambulatory Visit: Payer: Medicaid Other | Admitting: Diagnostic Neuroimaging

## 2018-06-01 ENCOUNTER — Other Ambulatory Visit: Payer: Self-pay | Admitting: Nurse Practitioner

## 2018-06-01 ENCOUNTER — Ambulatory Visit
Admission: RE | Admit: 2018-06-01 | Discharge: 2018-06-01 | Disposition: A | Discharge status: Home / Self Care | Payer: Medicaid Other | Source: Ambulatory Visit | Attending: Nurse Practitioner | Admitting: Nurse Practitioner

## 2018-06-01 DIAGNOSIS — N644 Mastodynia: Secondary | ICD-10-CM

## 2018-06-01 DIAGNOSIS — N6489 Other specified disorders of breast: Secondary | ICD-10-CM

## 2018-11-26 ENCOUNTER — Encounter: Payer: Self-pay | Admitting: Licensed Clinical Social Worker

## 2018-11-26 ENCOUNTER — Telehealth: Payer: Self-pay | Admitting: Licensed Clinical Social Worker

## 2018-11-26 NOTE — Telephone Encounter (Signed)
A genetic counseling appt has been scheduled for the pt to see Faith Rogue on 7/6 at 1pm. Letter mailed.

## 2018-12-02 ENCOUNTER — Other Ambulatory Visit: Payer: Self-pay

## 2018-12-02 ENCOUNTER — Ambulatory Visit
Admission: RE | Admit: 2018-12-02 | Discharge: 2018-12-02 | Disposition: A | Discharge status: Home / Self Care | Payer: Medicaid Other | Source: Ambulatory Visit | Attending: Nurse Practitioner | Admitting: Nurse Practitioner

## 2018-12-02 DIAGNOSIS — N6489 Other specified disorders of breast: Secondary | ICD-10-CM

## 2018-12-17 ENCOUNTER — Telehealth: Payer: Self-pay | Admitting: Licensed Clinical Social Worker

## 2018-12-17 ENCOUNTER — Other Ambulatory Visit: Payer: Medicaid Other

## 2018-12-17 NOTE — Telephone Encounter (Signed)
Called patient regarding upcoming [facility] appointment, patient would prefer this to be a walk-in visit. °

## 2018-12-21 ENCOUNTER — Encounter: Payer: Self-pay | Admitting: Licensed Clinical Social Worker

## 2018-12-21 ENCOUNTER — Other Ambulatory Visit: Payer: Self-pay

## 2018-12-21 ENCOUNTER — Inpatient Hospital Stay: Payer: Medicaid Other | Attending: Genetic Counselor | Admitting: Licensed Clinical Social Worker

## 2018-12-21 ENCOUNTER — Inpatient Hospital Stay: Payer: Medicaid Other

## 2018-12-21 DIAGNOSIS — Z8041 Family history of malignant neoplasm of ovary: Secondary | ICD-10-CM | POA: Diagnosis not present

## 2018-12-21 DIAGNOSIS — Z808 Family history of malignant neoplasm of other organs or systems: Secondary | ICD-10-CM

## 2018-12-21 DIAGNOSIS — Z803 Family history of malignant neoplasm of breast: Secondary | ICD-10-CM | POA: Diagnosis not present

## 2018-12-21 NOTE — Progress Notes (Signed)
REFERRING PROVIDER: Berkley Harvey, NP [place],  [place] [address]  PRIMARY PROVIDER:  Patient, No Pcp Per  PRIMARY REASON FOR VISIT:  1. Family history of breast cancer   2. Family history of ovarian cancer   3. Family history of brain cancer      HISTORY OF PRESENT ILLNESS:   Ms. Laroque, a 48 y.o. female, was seen for a [facility] cancer genetics consultation at the request of Sindy Messing, NP due to a family history of cancer.  Ms. Babineau presents to clinic today to discuss the possibility of a hereditary predisposition to cancer, genetic testing, and to further clarify her future cancer risks, as well as potential cancer risks for family members.   Ms. Deakins is a 48 y.o. female with no personal history of cancer. She described struggling over the past 3 years with dense breasts, and a "spiderweb like mass" that has not been able to be biopsied.   RISK FACTORS:  Menarche was at age 33.  First live birth at age 81.  OCP use for approximately 0 years.  Ovaries intact: yes.  Hysterectomy: no.  Menopausal status: premenopausal.  HRT use: 0 years. Mammogram within the last year: yes. Number of breast biopsies: 0.   Past Medical History:  Diagnosis Date  . Family history of brain cancer   . Family history of breast cancer   . Family history of ovarian cancer     Past Surgical History:  Procedure Laterality Date  . BACK SURGERY    . BIOPSY BREAST Left 2016    Social History   Socioeconomic History  . Marital status: Married    Spouse name: Not on file  . Number of children: Not on file  . Years of education: Not on file  . Highest education level: Not on file  Occupational History  . Not on file  Social Needs  . Financial resource strain: Not on file  . Food insecurity    Worry: Not on file    Inability: Not on file  . Transportation needs    Medical: Not on file    Non-medical: Not on file  Tobacco Use  . Smoking status: Never Smoker  .  Smokeless tobacco: Never Used  Substance and Sexual Activity  . Alcohol use: No  . Drug use: No  . Sexual activity: Yes    Birth control/protection: None  Lifestyle  . Physical activity    Days per week: Not on file    Minutes per session: Not on file  . Stress: Not on file  Relationships  . Social Herbalist on phone: Not on file    Gets together: Not on file    Attends religious service: Not on file    Active member of club or organization: Not on file    Attends meetings of clubs or organizations: Not on file    Relationship status: Not on file  Other Topics Concern  . Not on file  Social History Narrative  . Not on file     FAMILY HISTORY:  We obtained a detailed, 4-generation family history.  Significant diagnoses are listed below: Family History  Problem Relation Age of Onset  . Diabetes Mother   . Breast cancer Mother 41  . Heart disease Father   . Leukemia Maternal Uncle   . Bone cancer Maternal Uncle   . Breast cancer Sister 43  . Ovarian cancer Maternal Aunt  dx 51s  . Ovarian cancer Paternal Aunt        d. 78  . Brain cancer Paternal Uncle   . Breast cancer Paternal Aunt        d. 67   Ms. Ciano has 2 daughters, ages 65 and 17, and two sons ages 74 and 53, no cancer diagnoses. She has 3 sisters and 4 brothers. One of her sisters was recently diagnosed with breast cancer at 13 and reportedly had genetic testing that showed a high risk for ovarian cancer. The patient does not have test results available.   Ms. Enlow mother was diagnosed with breast cancer at 48 and is living at 84. The patient had 3 maternal uncles, 2 maternal aunts. One of her aunts had ovarian cancer in her 80s and is living in her 74s. An uncle had a blood cancer, and another uncle had bone cancer and died "young". One of her maternal cousins also had ovarian cancer. No known cancers for her maternal grandparents, both died in their 40s.  Ms. Grullon father is living at 67.  The patient had 2 paternal aunts, 4 paternal uncles. One of her aunts recently died of breast cancer at 84, and the other aunt died of ovarian cancer at 83. One of her uncles had brain cancer and passed away. No known cancers in paternal cousins. Her paternal grandfather died in his 40s, paternal grandmother died in her 56s and she had a brain tumor.  Ms. Mcfall is aware of previous family history of genetic testing for hereditary cancer risks. Patient's family is from [place]. There is no reported Ashkenazi Jewish ancestry. There is no known consanguinity.  GENETIC COUNSELING ASSESSMENT: Ms. Phillips is a 48 y.o. female with a family history which is somewhat suggestive of a hereditary cancer syndrome and predisposition to cancer. We, therefore, discussed and recommended the following at today's visit.   DISCUSSION: We discussed that 5 - 10% of breast cancer is hereditary, with most cases associated with the BRCA1/BRCA2 genes.  There are other genes that can be associated with hereditary breast cancer syndromes.  We also discussed that ovarian cancer can be hereditary, and that it sounds like her sister tested positive for a gene associated with hereditary ovarian cancer. If this is the case, she would have a 50% chance to have a mutation in that gene. The patient will attempt to get a copy of her sister's test results.   We reviewed the characteristics, features and inheritance patterns of hereditary cancer syndromes. We also discussed genetic testing, including the appropriate family members to test, the process of testing, insurance coverage and turn-around-time for results. We discussed the implications of a negative, positive and/or variant of uncertain significant result. We recommended Ms. Oatis pursue genetic testing for the Multi-Cancer gene panel.   The Multi-Cancer Panel offered by [facility] includes sequencing and/or deletion duplication testing of the following 85 genes: AIP, ALK, APC, ATM, AXIN2,BAP1,   BARD1, BLM, BMPR1A, BRCA1, BRCA2, BRIP1, CASR, CDC73, CDH1, CDK4, CDKN1B, CDKN1C, CDKN2A (p14ARF), CDKN2A (p16INK4a), CEBPA, CHEK2, CTNNA1, DICER1, DIS3L2, EGFR (c.2369C>T, p.Thr790Met variant only), EPCAM (Deletion/duplication testing only), FH, FLCN, GATA2, GPC3, GREM1 (Promoter region deletion/duplication testing only), HOXB13 (c.251G>A, p.Gly84Glu), HRAS, KIT, MAX, MEN1, MET, MITF (c.952G>A, p.Glu318Lys variant only), MLH1, MSH2, MSH3, MSH6, MUTYH, NBN, NF1, NF2, NTHL1, PALB2, PDGFRA, PHOX2B, PMS2, POLD1, POLE, POT1, PRKAR1A, PTCH1, PTEN, RAD50, RAD51C, RAD51D, RB1, RECQL4, RET, RNF43, RUNX1, SDHAF2, SDHA (sequence changes only), SDHB, SDHC, SDHD, SMAD4, SMARCA4, SMARCB1, SMARCE1, STK11, SUFU, TERC,  TERT, TMEM127, TP53, TSC1, TSC2, VHL, WRN and WT1.   Based on Ms. Seldon's family history of cancer, she meets medical criteria for genetic testing. Despite that she meets criteria, she may still have an out of pocket cost.   Based on the patient's family history, a statistical model [employer] cabin [employer]) was used to estimate her risk of developing breast cancer. This estimates her lifetime risk of developing breast cancer to be approximately 29%. This estimation is in the setting of negative test results, and may change based on test results. The patient's lifetime breast cancer risk is a preliminary estimate based on available information using one of several models endorsed by the [place] (ACS). The ACS recommends consideration of breast MRI screening as an adjunct to mammography for patients at high risk (defined as 20% or greater lifetime risk).     Ms. Fruchter has been determined to be at high risk for breast cancer.  Therefore, we recommend that annual screening with mammography and breast MRI be performed.     PLAN: After considering the risks, benefits, and limitations, Ms. Sarkisyan provided informed consent to pursue genetic testing and the blood sample was sent to [facility] for  analysis of the Multi-Cancer Panel. Results should be available within approximately 2-3 weeks' time, at which point they will be disclosed by telephone to Ms. Amundson, as will any additional recommendations warranted by these results. Ms. Barb will receive a summary of her genetic counseling visit and a copy of her results once available. This information will also be available in Epic.   Based on Ms. Bouillon's family history, we recommended her maternal and paternal relatives have genetic counseling and testing. Ms. Shoberg will let us know if we can be of any assistance in coordinating genetic counseling and/or testing for this family member.   Lastly, we encouraged Ms. Hasegawa to remain in contact with cancer genetics annually so that we can continuously update the family history and inform her of any changes in cancer genetics and testing that may be of benefit for this family.   Ms. Maultsby questions were answered to her satisfaction today. Our contact information was provided should additional questions or concerns arise. Thank you for the referral and allowing [place] to share in the care of your patient.   Faith Rogue, MS Genetic Counselor [email or web address] Phone: [phone]  The patient was seen for a total of 35 minutes in face-to-face genetic counseling.  Drs. Magrinat, Lindi Adie and/or Burr Medico were available for discussion regarding this case.

## 2018-12-29 ENCOUNTER — Telehealth: Payer: Self-pay | Admitting: Licensed Clinical Social Worker

## 2019-01-05 ENCOUNTER — Ambulatory Visit: Payer: Self-pay | Admitting: Licensed Clinical Social Worker

## 2019-01-05 ENCOUNTER — Encounter: Payer: Self-pay | Admitting: Licensed Clinical Social Worker

## 2019-01-05 DIAGNOSIS — Z1379 Encounter for other screening for genetic and chromosomal anomalies: Secondary | ICD-10-CM | POA: Insufficient documentation

## 2019-01-05 DIAGNOSIS — Z803 Family history of malignant neoplasm of breast: Secondary | ICD-10-CM

## 2019-01-05 DIAGNOSIS — Z8041 Family history of malignant neoplasm of ovary: Secondary | ICD-10-CM

## 2019-01-05 DIAGNOSIS — Z808 Family history of malignant neoplasm of other organs or systems: Secondary | ICD-10-CM

## 2019-01-05 NOTE — Telephone Encounter (Signed)
Revealed negative genetic testing.  Revealed that 3 variants of uncertain significance were identified: one in one of her RAD51C genes, one in one of her TERT genes, and one in one of her WRN genes. This normal result is reassuring. It is unlikely that there is an increased risk of cancer due to a mutation in one of these genes.  However, genetic testing is not perfect, and cannot definitively rule out a hereditary cause.  It will be important for her to keep in contact with genetics to learn if any additional testing may be needed in the future. We also discussed that we would like to review her sisters test results, as she reported they were positive. [name] [name] said she will email a copy to me.

## 2019-01-05 NOTE — Progress Notes (Signed)
HPI:  Ms. Kilroy was previously seen in the [place] clinic due to a family history of cancer and concerns regarding a hereditary predisposition to cancer. Please refer to our prior cancer genetics clinic note for more information regarding our discussion, assessment and recommendations, at the time. Ms. Stankovich recent genetic test results were disclosed to her, as were recommendations warranted by these results. These results and recommendations are discussed in more detail below.  Ms. Briggs is a 48 y.o. female with no personal history of cancer. She described struggling over the past 3 years with dense breasts, and a "spiderweb like mass" that has not been able to be biopsied.   CANCER HISTORY:  Oncology History   No history exists.    FAMILY HISTORY:  We obtained a detailed, 4-generation family history.  Significant diagnoses are listed below: Family History  Problem Relation Age of Onset   Diabetes Mother    Breast cancer Mother 63   Heart disease Father    Leukemia Maternal Uncle    Bone cancer Maternal Uncle    Breast cancer Sister 72   Ovarian cancer Maternal Aunt        dx 60s   Ovarian cancer Paternal Aunt        d. 73   Brain cancer Paternal Uncle    Breast cancer Paternal Aunt        d. 57    Ms. Depaoli has 2 daughters, ages 68 and 59, and two sons ages 15 and 35, no cancer diagnoses. She has 3 sisters and 4 brothers. One of her sisters was recently diagnosed with breast cancer at 25 and reportedly had genetic testing that showed a high risk for ovarian cancer. The patient does not have test results available.   Ms. Lieber mother was diagnosed with breast cancer at 18 and is living at 63. The patient had 3 maternal uncles, 2 maternal aunts. One of her aunts had ovarian cancer in her 59s and is living in her 26s. An uncle had a blood cancer, and another uncle had bone cancer and died "young". One of her maternal cousins also had ovarian cancer. No known  cancers for her maternal grandparents, both died in their 39s.  Ms. Hundal father is living at 12. The patient had 2 paternal aunts, 4 paternal uncles. One of her aunts recently died of breast cancer at 60, and the other aunt died of ovarian cancer at 8. One of her uncles had brain cancer and passed away. No known cancers in paternal cousins. Her paternal grandfather died in his 22s, paternal grandmother died in her 36s and she had a brain tumor.  Ms. Polinsky is aware of previous family history of genetic testing for hereditary cancer risks. Patient's family is from [place]. There is no reported Ashkenazi Jewish ancestry. There is no known consanguinity.  GENETIC TEST RESULTS: Genetic testing reported out on 12/27/2018 through the Multi- cancer panel found no pathogenic mutations. The Multi-Cancer Panel offered by [facility] includes sequencing and/or deletion duplication testing of the following 85 genes: AIP, ALK, APC, ATM, AXIN2,BAP1,  BARD1, BLM, BMPR1A, BRCA1, BRCA2, BRIP1, CASR, CDC73, CDH1, CDK4, CDKN1B, CDKN1C, CDKN2A (p14ARF), CDKN2A (p16INK4a), CEBPA, CHEK2, CTNNA1, DICER1, DIS3L2, EGFR (c.2369C>T, p.Thr790Met variant only), EPCAM (Deletion/duplication testing only), FH, FLCN, GATA2, GPC3, GREM1 (Promoter region deletion/duplication testing only), HOXB13 (c.251G>A, p.Gly84Glu), HRAS, KIT, MAX, MEN1, MET, MITF (c.952G>A, p.Glu318Lys variant only), MLH1, MSH2, MSH3, MSH6, MUTYH, NBN, NF1, NF2, NTHL1, PALB2, PDGFRA, PHOX2B, PMS2, POLD1, POLE, POT1,  PRKAR1A, PTCH1, PTEN, RAD50, RAD51C, RAD51D, RB1, RECQL4, RET, RNF43, RUNX1, SDHAF2, SDHA (sequence changes only), SDHB, SDHC, SDHD, SMAD4, SMARCA4, SMARCB1, SMARCE1, STK11, SUFU, TERC, TERT, TMEM127, TP53, TSC1, TSC2, VHL, WRN and WT1. The test report has been scanned into EPIC and is located under the Molecular Pathology section of the Results Review tab.  A portion of the result report is included below for reference.    We discussed with Ms. Javid that  because current genetic testing is not perfect, it is possible there may be a gene mutation in one of these genes that current testing cannot detect, but that chance is small.  We also discussed, that there could be another gene that has not yet been discovered, or that we have not yet tested, that is responsible for the cancer diagnoses in the family. It is also possible there is a hereditary cause for the cancer in the family that Ms. Bouyer did not inherit and therefore was not identified in her testing.  Therefore, it is important to remain in touch with cancer genetics in the future so that we can continue to offer Ms. Utke the most up to date genetic testing.   Genetic testing did identify 3 Variants of uncertain significance (VUS) - one in the RAD51C gene called c.431T>C, a second in the TERT gene called c.604G>A, and a third in the WRN gene called c.1270-10T>G.  At this time, it is unknown if these variants are associated with increased cancer risk or if they are normal findings, but most variants such as these get reclassified to being inconsequential. They should not be used to make medical management decisions. With time, we suspect the lab will determine the significance of these variants, if any. If we do learn more about them, we will try to contact Ms. Savard to discuss it further. However, it is important to stay in touch with [place] periodically and keep the address and phone number up to date.  ADDITIONAL GENETIC TESTING: We discussed with Ms. Full that her genetic testing was fairly extensive.  If there are genes identified to increase cancer risk that can be analyzed in the future, we would be happy to discuss and coordinate this testing at that time.    CANCER SCREENING RECOMMENDATIONS: Ms. Wanamaker test result is considered negative (normal).  This means that we have not identified a hereditary cause for her family history of cancer at this time.   While reassuring, this does not definitively  rule out a hereditary predisposition to cancer. It is still possible that there could be genetic mutations that are undetectable by current technology. There could be genetic mutations in genes that have not been tested or identified to increase cancer risk.  Therefore, it is recommended she continue to follow the cancer management and screening guidelines provided by her oncology and primary healthcare provider.   We also discussed that it sounds like there is a hereditary cause for some of the cancer in her family since she reported her sister had positive genetic testing. We asked Ms. Ohmer to provide [place] with a copy of this test report to ensure that the lab looked for the particular gene mutation her sister is positive for.   An individual's cancer risk and medical management are not determined by genetic test results alone. Overall cancer risk assessment incorporates additional factors, including personal medical history, family history, and any available genetic information that may result in a personalized plan for cancer prevention and surveillance.  Based on  the patient's family history and negative genetic test results, a statistical model Harriett Rush) was used to estimate her risk of developing breast cancer. This estimates her lifetime risk of developing breast cancer to be approximately 29%.. The patient's lifetime breast cancer risk is a preliminary estimate based on available information using one of several models endorsed by the [place] (ACS). The ACS recommends consideration of breast MRI screening as an adjunct to mammography for patients at high risk (defined as 20% or greater lifetime risk).     Ms. Hagger has been determined to be at high risk for breast cancer. Therefore, we recommend that annual screening with mammography and breast MRI be performed.     RECOMMENDATIONS FOR FAMILY MEMBERS:  Relatives in this family might be at some increased risk of developing  cancer, over the general population risk, simply due to the family history of cancer.  We recommended female relatives in this family have a yearly mammogram beginning at age 41, or 80 years younger than the earliest onset of cancer, an annual clinical breast exam, and perform monthly breast self-exams. Female relatives in this family should also have a gynecological exam as recommended by their primary provider. All family members should have a colonoscopy by age 54, or as directed by their physicians.   It is also possible there is a hereditary cause for the cancer in Ms. 67 family that she did not inherit and therefore was not identified in her.  Based on Ms. Damaso's family history, we recommended her maternal and paternal relatives have genetic counseling and testing. Ms. Sula will let us know if we can be of any assistance in coordinating genetic counseling and/or testing for these family members.  FOLLOW-UP: Lastly, we discussed with Ms. Eugene that cancer genetics is a rapidly advancing field and it is possible that new genetic tests will be appropriate for her and/or her family members in the future. We encouraged her to remain in contact with cancer genetics on an annual basis so we can update her personal and family histories and let her know of advances in cancer genetics that may benefit this family.   Our contact number was provided. Ms. Lindseth questions were answered to her satisfaction, and she knows she is welcome to call us at anytime with additional questions or concerns.   Faith Rogue, MS, [place] Genetic Counselor [email or web address] Phone: [phone]

## 2019-01-15 ENCOUNTER — Encounter: Payer: Self-pay | Admitting: *Deleted

## 2019-02-08 ENCOUNTER — Ambulatory Visit: Payer: Medicaid Other | Admitting: Family Medicine

## 2019-02-08 ENCOUNTER — Other Ambulatory Visit: Payer: Self-pay

## 2019-02-08 ENCOUNTER — Encounter: Payer: Self-pay | Admitting: Family Medicine

## 2019-02-08 ENCOUNTER — Other Ambulatory Visit (HOSPITAL_COMMUNITY)
Admission: RE | Admit: 2019-02-08 | Discharge: 2019-02-08 | Disposition: A | Discharge status: Home / Self Care | Payer: Medicaid Other | Source: Ambulatory Visit | Attending: Family Medicine | Admitting: Family Medicine

## 2019-02-08 ENCOUNTER — Encounter: Payer: Self-pay | Admitting: General Practice

## 2019-02-08 VITALS — BP 108/73 | HR 88 | Ht 66.0 in | Wt 140.0 lb

## 2019-02-08 DIAGNOSIS — N83201 Unspecified ovarian cyst, right side: Secondary | ICD-10-CM | POA: Insufficient documentation

## 2019-02-08 DIAGNOSIS — Z124 Encounter for screening for malignant neoplasm of cervix: Secondary | ICD-10-CM

## 2019-02-08 DIAGNOSIS — Z30431 Encounter for routine checking of intrauterine contraceptive device: Secondary | ICD-10-CM

## 2019-02-08 DIAGNOSIS — Z3009 Encounter for other general counseling and advice on contraception: Secondary | ICD-10-CM | POA: Diagnosis not present

## 2019-02-08 DIAGNOSIS — N939 Abnormal uterine and vaginal bleeding, unspecified: Secondary | ICD-10-CM

## 2019-02-08 DIAGNOSIS — Z302 Encounter for sterilization: Secondary | ICD-10-CM | POA: Insufficient documentation

## 2019-02-08 NOTE — Patient Instructions (Signed)
Laparoscopic Tubal Ligation Laparoscopic tubal ligation is a procedure to close the fallopian tubes. This is done so that you cannot get pregnant. When the fallopian tubes are closed, the eggs that your ovaries release cannot enter the uterus, and sperm cannot reach the released eggs. You should not have this procedure if you want to get pregnant someday or if you are unsure about having more children. Tell a health care provider about:  Any allergies you have.  All medicines you are taking, including vitamins, herbs, eye drops, creams, and over-the-counter medicines.  Any problems you or family members have had with anesthetic medicines.  Any blood disorders you have.  Any surgeries you have had.  Any medical conditions you have.  Whether you are pregnant or may be pregnant.  Any past pregnancies. What are the risks? Generally, this is a safe procedure. However, problems may occur, including:  Infection.  Bleeding.  Injury to other organs in the abdomen.  Side effects from anesthetic medicines.  Failure of the procedure. This procedure can increase your risk of a kind of pregnancy in which a fertilized egg attaches to the outside of the uterus (ectopic pregnancy). What happens before the procedure? Medicines  Ask your health care provider about: ? Changing or stopping your regular medicines. This is especially important if you are taking diabetes medicines or blood thinners. ? Taking medicines such as aspirin and ibuprofen. These medicines can thin your blood. Do not take these medicines unless your health care provider tells you to take them. ? Taking over-the-counter medicines, vitamins, herbs, and supplements. Staying hydrated  Follow instructions from your health care provider about hydration, which may include: ? Up to 2 hours before the procedure - you may continue to drink clear liquids, such as water, clear fruit juice, black coffee, and plain tea. Eating and  drinking  Follow instructions from your health care provider about eating and drinking, which may include: ? 8 hours before the procedure - stop eating heavy meals or foods, such as meat, fried foods, or fatty foods. ? 6 hours before the procedure - stop eating light meals or foods, such as toast or cereal. ? 6 hours before the procedure - stop drinking milk or drinks that contain milk. ? 2 hours before the procedure - stop drinking clear liquids. General instructions  Do not use any products that contain nicotine or tobacco for at least 4 weeks before the procedure. These products include cigarettes, e-cigarettes, and chewing tobacco. If you need help quitting, ask your health care provider.  Plan to have someone take you home from the hospital.  If you will be going home right after the procedure, plan to have someone with you for 24 hours.  Ask your health care provider: ? How your surgery site will be marked. ? What steps will be taken to help prevent infection. These may include:  Removing hair at the surgery site.  Washing skin with a germ-killing soap.  Taking antibiotic medicine. What happens during the procedure?      An IV will be inserted into one of your veins.  You will be given one or more of the following: ? A medicine to help you relax (sedative). ? A medicine to numb the area (local anesthetic). ? A medicine to make you fall asleep (general anesthetic). ? A medicine that is injected into an area of your body to numb everything below the injection site (regional anesthetic).  Your bladder may be emptied with a small tube (  catheter).  If you have been given a general anesthetic, a tube will be put down your throat to help you breathe.  Two small incisions will be made in your lower abdomen and near your belly button.  Your abdomen will be inflated with a gas. This will let the surgeon see better and will give the surgeon room to work.  A thin, lighted tube  (laparoscope) with a camera attached will be inserted into your abdomen through one of the incisions. Small instruments will be inserted through the other incision.  The fallopian tubes will be tied off, burned (cauterized), or blocked with a clip, ring, or clamp. A small portion in the center of each fallopian tube may be removed.  The gas will be released from the abdomen.  The incisions will be closed with stitches (sutures).  A bandage (dressing) will be placed over the incisions. The procedure may vary among health care providers and hospitals. What happens after the procedure?  Your blood pressure, heart rate, breathing rate, and blood oxygen level will be monitored until you leave the hospital.  You will be given medicine to help with pain, nausea, and vomiting as needed. Summary  Laparoscopic tubal ligation is a procedure that is done so that you cannot get pregnant.  You should not have this procedure if you want to get pregnant someday or if you are unsure about having more children.  The procedure is done using a thin, lighted tube (laparoscope) with a camera attached that will be inserted into your abdomen through an incision.  Follow instructions from your health care provider about eating and drinking before the procedure. This information is not intended to replace advice given to you by your health care provider. Make sure you discuss any questions you have with your health care provider. Document Released: 09/09/2000 Document Revised: 04/28/2018 Document Reviewed: 04/28/2018 Elsevier Patient Education  2020 [employer] Preventive Care 67-20 Years Old, Female Preventive care refers to visits with your health care provider and lifestyle choices that can promote health and wellness. This includes:  A yearly physical exam. This may also be called an annual well check.  Regular dental visits and eye exams.  Immunizations.  Screening for certain  conditions.  Healthy lifestyle choices, such as eating a healthy diet, getting regular exercise, not using drugs or products that contain nicotine and tobacco, and limiting alcohol use. What can I expect for my preventive care visit? Physical exam Your health care provider will check your:  Height and weight. This may be used to calculate body mass index (BMI), which tells if you are at a healthy weight.  Heart rate and blood pressure.  Skin for abnormal spots. Counseling Your health care provider may ask you questions about your:  Alcohol, tobacco, and drug use.  Emotional well-being.  Home and relationship well-being.  Sexual activity.  Eating habits.  Work and work Statistician.  Method of birth control.  Menstrual cycle.  Pregnancy history. What immunizations do I need?  Influenza (flu) vaccine  This is recommended every year. Tetanus, diphtheria, and pertussis (Tdap) vaccine  You may need a Td booster every 10 years. Varicella (chickenpox) vaccine  You may need this if you have not been vaccinated. Zoster (shingles) vaccine  You may need this after age 67. Measles, mumps, and rubella (MMR) vaccine  You may need at least one dose of MMR if you were born in 1957 or later. You may also need a second dose. Pneumococcal conjugate (PCV13) vaccine  You may need this if you have certain conditions and were not previously vaccinated. Pneumococcal polysaccharide (PPSV23) vaccine  You may need one or two doses if you smoke cigarettes or if you have certain conditions. Meningococcal conjugate (MenACWY) vaccine  You may need this if you have certain conditions. Hepatitis A vaccine  You may need this if you have certain conditions or if you travel or work in places where you may be exposed to hepatitis A. Hepatitis B vaccine  You may need this if you have certain conditions or if you travel or work in places where you may be exposed to hepatitis B. Haemophilus  influenzae type b (Hib) vaccine  You may need this if you have certain conditions. Human papillomavirus (HPV) vaccine  If recommended by your health care provider, you may need three doses over 6 months. You may receive vaccines as individual doses or as more than one vaccine together in one shot (combination vaccines). Talk with your health care provider about the risks and benefits of combination vaccines. What tests do I need? Blood tests  Lipid and cholesterol levels. These may be checked every 5 years, or more frequently if you are over 52 years old.  Hepatitis C test.  Hepatitis B test. Screening  Lung cancer screening. You may have this screening every year starting at age 89 if you have a 30-pack-year history of smoking and currently smoke or have quit within the past 15 years.  Colorectal cancer screening. All adults should have this screening starting at age 78 and continuing until age 64. Your health care provider may recommend screening at age 100 if you are at increased risk. You will have tests every 1-10 years, depending on your results and the type of screening test.  Diabetes screening. This is done by checking your blood sugar (glucose) after you have not eaten for a while (fasting). You may have this done every 1-3 years.  Mammogram. This may be done every 1-2 years. Talk with your health care provider about when you should start having regular mammograms. This may depend on whether you have a family history of breast cancer.  BRCA-related cancer screening. This may be done if you have a family history of breast, ovarian, tubal, or peritoneal cancers.  Pelvic exam and Pap test. This may be done every 3 years starting at age 51. Starting at age 29, this may be done every 5 years if you have a Pap test in combination with an HPV test. Other tests  Sexually transmitted disease (STD) testing.  Bone density scan. This is done to screen for osteoporosis. You may have this  scan if you are at high risk for osteoporosis. Follow these instructions at home: Eating and drinking  Eat a diet that includes fresh fruits and vegetables, whole grains, lean protein, and low-fat dairy.  Take vitamin and mineral supplements as recommended by your health care provider.  Do not drink alcohol if: ? Your health care provider tells you not to drink. ? You are pregnant, may be pregnant, or are planning to become pregnant.  If you drink alcohol: ? Limit how much you have to 0-1 drink a day. ? Be aware of how much alcohol is in your drink. In the [place] one drink equals one 12 oz bottle of beer (355 mL), one 5 oz glass of wine (148 mL), or one 1 oz glass of hard liquor (44 mL). Lifestyle  Take daily care of your teeth and gums.  Stay active.  Exercise for at least 30 minutes on 5 or more days each week.  Do not use any products that contain nicotine or tobacco, such as cigarettes, e-cigarettes, and chewing tobacco. If you need help quitting, ask your health care provider.  If you are sexually active, practice safe sex. Use a condom or other form of birth control (contraception) in order to prevent pregnancy and STIs (sexually transmitted infections).  If told by your health care provider, take low-dose aspirin daily starting at age 15. What's next?  Visit your health care provider once a year for a well check visit.  Ask your health care provider how often you should have your eyes and teeth checked.  Stay up to date on all vaccines. This information is not intended to replace advice given to you by your health care provider. Make sure you discuss any questions you have with your health care provider. Document Released: 06/30/2015 Document Revised: 02/12/2018 Document Reviewed: 02/12/2018 Elsevier Patient Education  2020 [employer].

## 2019-02-08 NOTE — Assessment & Plan Note (Signed)
Will remove her left tube for steriliztion and will decrease risk of ovarian cancer-->risks reviewed. Risks include but are not limited to bleeding, infection, injury to surrounding structures, including bowel, bladder and ureters, blood clots, and death.  Likelihood of success is high. Patient counseled, r.e. Risks benefits of BTL, including permanency of procedure, risk of failure(1:100), increased risk of ectopic.  Patient verbalized understanding and desires to proceed

## 2019-02-08 NOTE — Assessment & Plan Note (Signed)
Heavy vaginal bleeding likely related to copper IUD--will remove at time of sterilization.

## 2019-02-08 NOTE — Assessment & Plan Note (Signed)
normal u/s, likely related to IUD--if persists after removal, will consider endometrial ablation.

## 2019-02-08 NOTE — Progress Notes (Signed)
Subjective:    Patient ID: [name] [name] is a 48 y.o. female presenting with Menorrhagia (x 1 year)  on 02/08/2019  HPI: [id number] s/p right salpingectomy for heterotopic pregnancy. Here for abnormal bleeding. Initially with Mirena x 7 years after birth of last child. Then changed to Copper due to weight gain. Was working well and in x 2 years and now noting some increased spotting and bleeding. U/s ok. Also with right sided pain x a few months. Noted to have 4.1 cm simple right ovariean cyst.  Also with strong f/h of ovarian and breast cancer with essentially normal genetic testing, though several unknown variants noted. Does not want more kids. No pap in 3.5 years, last one in [place].  Review of Systems  Constitutional: Negative for chills and fever.  Respiratory: Negative for shortness of breath.   Cardiovascular: Negative for chest pain.  Gastrointestinal: Negative for abdominal pain, nausea and vomiting.  Genitourinary: Positive for pelvic pain. Negative for dysuria.  Skin: Negative for rash.      Objective:    BP 108/73   Pulse 88   Ht 5\' 6"  (1.676 m)   Wt 140 lb (63.5 kg)   LMP 01/09/2019 (Within Days)   BMI 22.60 kg/m  Physical Exam Constitutional:      General: She is not in acute distress.    Appearance: She is well-developed.  HENT:     Head: Normocephalic and atraumatic.  Eyes:     General: No scleral icterus. Neck:     Musculoskeletal: Neck supple.  Cardiovascular:     Rate and Rhythm: Normal rate.  Pulmonary:     Effort: Pulmonary effort is normal.  Abdominal:     Palpations: Abdomen is soft.  Genitourinary:    Comments: BUS normal, vagina is pink and rugated, cervix is parous without lesion, uterus is small and anteverted, no adnexal mass or tenderness on left. Some minimal tenderness on right..  Skin:    General: Skin is warm and dry.  Neurological:     Mental Status: She is alert and oriented to person, place, and time.         Assessment &  Plan:   Problem List Items Addressed This Visit      Unprioritized   IUD check up    Heavy vaginal bleeding likely related to copper IUD--will remove at time of sterilization.      Cyst of right ovary    Appeared simple--will repeat u/s      Relevant Orders   US PELVIC COMPLETE WITH TRANSVAGINAL   Abnormal uterine bleeding    normal u/s, likely related to IUD--if persists after removal, will consider endometrial ablation.      Sterilization consult    Will remove her left tube for steriliztion and will decrease risk of ovarian cancer-->risks reviewed. Risks include but are not limited to bleeding, infection, injury to surrounding structures, including bowel, bladder and ureters, blood clots, and death.  Likelihood of success is high. Patient counseled, r.e. Risks benefits of BTL, including permanency of procedure, risk of failure(1:100), increased risk of ectopic.  Patient verbalized understanding and desires to proceed        Other Visit Diagnoses    Screening for cervical cancer    -  Primary   Relevant Orders   Cytology - PAP( [place])      Total face-to-face time with patient: 20 minutes. Over 50% of encounter was spent on counseling and coordination of care. Return in about 3  months (around 05/11/2019) for postop check.  [name] [name] 02/08/2019 3:26 PM

## 2019-02-08 NOTE — Assessment & Plan Note (Signed)
Appeared simple--will repeat u/s

## 2019-02-11 LAB — CYTOLOGY - PAP
Diagnosis: NEGATIVE
HPV: NOT DETECTED

## 2019-02-15 ENCOUNTER — Telehealth: Payer: Self-pay | Admitting: General Practice

## 2019-02-15 NOTE — Telephone Encounter (Signed)
Patient called into front office stating she has an upcoming surgery scheduled to have her tubes removed but she would like her ovaries removed as well. Patient states both her sisters had a cancer test in [place] and were told they were high risk for ovarian cancer, for this reason she would like the doctor to go ahead and remove both ovaries during the surgery. Told patient I would let Dr Kennon Rounds know and we will reach back out to her whenever we received a response. Patient verbalized understanding & had no other questions at this time.

## 2019-02-16 ENCOUNTER — Other Ambulatory Visit: Payer: Self-pay

## 2019-02-16 ENCOUNTER — Ambulatory Visit (HOSPITAL_COMMUNITY)
Admission: RE | Admit: 2019-02-16 | Discharge: 2019-02-16 | Disposition: A | Discharge status: Home / Self Care | Payer: Medicaid Other | Source: Ambulatory Visit | Attending: Family Medicine | Admitting: Family Medicine

## 2019-02-16 DIAGNOSIS — N83201 Unspecified ovarian cyst, right side: Secondary | ICD-10-CM | POA: Diagnosis present

## 2019-02-17 NOTE — Telephone Encounter (Signed)
Called patient and stated Dr Kennon Rounds is fine to discuss removing her ovaries during the surgery as long as insurance approves it. Patient verbalized understanding & states she just got her letter in the mail and saw the surgery is scheduled on a Wednesday. She wants to know if there is anyway it can be on a Tuesday. Told patient I wasn't sure, she would have to call our surgery scheduler at the number on the letter to ask. She verbalized understanding. Told patient Dr Kennon Rounds wants to schedule her for a virtual appt to discuss removing her ovaries since you all had not discussed it previously. Told patient someone from our front office staff will call her with this appt. She verbalized understanding & had no questions.

## 2019-02-25 ENCOUNTER — Telehealth: Payer: Self-pay | Admitting: Obstetrics & Gynecology

## 2019-02-25 NOTE — Telephone Encounter (Signed)
The patient stated she has surgery oct. 28. The surgery is for the tube to be taken out. She stated she initially requested the ovary be taken out as well. She cancelled the surgery to take the ovary out because the test results came back and her ovary is doing fine. She stated she would like to keep the surgery for the tube removal.   The patient requested the message be given to Dr. Kennon Rounds. I will send an in-basket message as well.

## 2019-02-25 NOTE — Telephone Encounter (Signed)
Called patient stating I am returning her phone call. She states she went back to her country to be tested and everything is fine with her ovaries. She doesn't want her ovaries removed because she found out there is a lot of side effects from that. She apologizes for being confusing but only wants her tubes removed not her ovaries & that is her final decision. Told her I would let Dr Kennon Rounds know and we will cancel her office appt on 10/7. Patient verbalized understanding & had no questions.

## 2019-03-24 ENCOUNTER — Ambulatory Visit: Payer: Medicaid Other | Admitting: Family Medicine

## 2019-04-07 ENCOUNTER — Encounter (HOSPITAL_BASED_OUTPATIENT_CLINIC_OR_DEPARTMENT_OTHER): Payer: Self-pay | Admitting: *Deleted

## 2019-04-07 ENCOUNTER — Other Ambulatory Visit: Payer: Self-pay

## 2019-04-08 NOTE — Progress Notes (Signed)
Left message for pt to come in for lab work.  

## 2019-04-09 NOTE — H&P (Signed)
[name] is an 48 y.o. [phone] female.   Chief Complaint: desires sterility, abnormal bleeding from CU IUD HPI: [id number] s/p right salpingectomy for heterotopic pregnancy. Here for abnormal bleeding. Initially with Mirena x 7 years after birth of last child. Then changed to Copper due to weight gain. Was working well and in x 2 years and now noting some increased spotting and bleeding. U/s ok. Also with right sided pain x a few months. Noted to have 4.1 cm simple right ovariean cyst.  Also with strong f/h of ovarian and breast cancer with essentially normal genetic testing, though several unknown variants noted. Does not want more kids.  Past Medical History:  Diagnosis Date  . Family history of brain cancer   . Family history of breast cancer   . Family history of ovarian cancer     Past Surgical History:  Procedure Laterality Date  . BACK SURGERY    . BIOPSY BREAST Left 2016    Family History  Problem Relation Age of Onset  . Diabetes Mother   . Breast cancer Mother 73  . Heart disease Father   . Leukemia Maternal Uncle   . Bone cancer Maternal Uncle   . Breast cancer Sister 9  . Ovarian cancer Maternal Aunt        dx 14s  . Ovarian cancer Paternal Aunt        d. 34  . Brain cancer Paternal Uncle   . Breast cancer Paternal Aunt        d. 63   Social History:  reports that she has quit smoking. She has never used smokeless tobacco. She reports that she does not drink alcohol or use drugs.  Allergies:  Allergies  Allergen Reactions  . Shellfish Allergy Itching    No medications prior to admission.    A comprehensive review of systems was negative.  Height 5\' 6"  (1.676 m), weight 61.2 kg, last menstrual period 04/07/2019. General appearance: alert, cooperative and appears stated age Head: Normocephalic, without obvious abnormality, atraumatic Neck: supple, symmetrical, trachea midline Lungs: normal effort Heart: regular rate and rhythm Abdomen: soft,  non-tender; bowel sounds normal; no masses,  no organomegaly Extremities: Homans sign is negative, no sign of DVT Skin: Skin color, texture, turgor normal. No rashes or lesions Neurologic: Grossly normal   Lab Results  Component Value Date   WBC 8.9 12/23/2008   HGB 11.6 (L) 12/23/2008   HCT 33.3 (L) 12/23/2008   MCV 87.8 12/23/2008   PLT 226 12/23/2008   Lab Results  Component Value Date   PREGTESTUR  12/23/2008    NEGATIVE        THE SENSITIVITY OF THIS METHODOLOGY IS >24 mIU/mL     Assessment/Plan Principal Problem:   Encounter for sterilization Active Problems:   Family history of ovarian cancer   Genetic testing   Abnormal uterine bleeding  For salpingectomy--has had 1 tube removed already Initially wanted bilateral oophorectomy, but changed her mind now.  Risks include but are not limited to bleeding, infection, injury to surrounding structures, including bowel, bladder and ureters, blood clots, and death.  Likelihood of success is high.    [name] [name] 04/09/2019, 10:56 AM

## 2019-04-10 ENCOUNTER — Other Ambulatory Visit (HOSPITAL_COMMUNITY)
Admission: RE | Admit: 2019-04-10 | Discharge: 2019-04-10 | Disposition: A | Discharge status: Home / Self Care | Payer: Medicaid Other | Source: Ambulatory Visit | Attending: Family Medicine | Admitting: Family Medicine

## 2019-04-10 DIAGNOSIS — Z01812 Encounter for preprocedural laboratory examination: Secondary | ICD-10-CM | POA: Insufficient documentation

## 2019-04-10 DIAGNOSIS — Z20828 Contact with and (suspected) exposure to other viral communicable diseases: Secondary | ICD-10-CM | POA: Insufficient documentation

## 2019-04-11 LAB — NOVEL CORONAVIRUS, NAA (HOSP ORDER, SEND-OUT TO REF LAB; TAT 18-24 HRS): SARS-CoV-2, NAA: NOT DETECTED

## 2019-04-13 ENCOUNTER — Encounter (HOSPITAL_BASED_OUTPATIENT_CLINIC_OR_DEPARTMENT_OTHER)
Admission: RE | Admit: 2019-04-13 | Discharge: 2019-04-13 | Disposition: A | Discharge status: Home / Self Care | Payer: Medicaid Other | Source: Ambulatory Visit | Attending: Family Medicine | Admitting: Family Medicine

## 2019-04-13 ENCOUNTER — Other Ambulatory Visit: Payer: Self-pay

## 2019-04-13 DIAGNOSIS — Z8041 Family history of malignant neoplasm of ovary: Secondary | ICD-10-CM | POA: Diagnosis not present

## 2019-04-13 DIAGNOSIS — Z01812 Encounter for preprocedural laboratory examination: Secondary | ICD-10-CM | POA: Insufficient documentation

## 2019-04-13 DIAGNOSIS — Z302 Encounter for sterilization: Secondary | ICD-10-CM | POA: Diagnosis present

## 2019-04-13 DIAGNOSIS — Z9079 Acquired absence of other genital organ(s): Secondary | ICD-10-CM | POA: Diagnosis not present

## 2019-04-13 DIAGNOSIS — Z87891 Personal history of nicotine dependence: Secondary | ICD-10-CM | POA: Diagnosis not present

## 2019-04-13 LAB — BASIC METABOLIC PANEL
Anion gap: 9 (ref 5–15)
BUN: 6 mg/dL (ref 6–20)
CO2: 23 mmol/L (ref 22–32)
Calcium: 9.2 mg/dL (ref 8.9–10.3)
Chloride: 106 mmol/L (ref 98–111)
Creatinine, Ser: 0.66 mg/dL (ref 0.44–1.00)
GFR calc Af Amer: >60 mL/min (ref >60)
GFR calc non Af Amer: >60 mL/min (ref >60)
Glucose, Bld: 89 mg/dL (ref 70–99)
Potassium: 4.5 mmol/L (ref 3.5–5.1)
Sodium: 138 mmol/L (ref 135–145)

## 2019-04-13 LAB — CBC
HCT: 36.6 % (ref 36.0–46.0)
Hemoglobin: 12.1 g/dL (ref 12.0–15.0)
MCH: 30.3 pg (ref 26.0–34.0)
MCHC: 33.1 g/dL (ref 30.0–36.0)
MCV: 91.5 fL (ref 80.0–100.0)
Platelets: 310 10*3/uL (ref 150–400)
RBC: 4 MIL/uL (ref 3.87–5.11)
RDW: 12.6 % (ref 11.5–15.5)
WBC: 6 10*3/uL (ref 4.0–10.5)
nRBC: 0 % (ref 0.0–0.2)

## 2019-04-13 LAB — TYPE AND SCREEN
ABO/RH(D): A NEG
Antibody Screen: NEGATIVE

## 2019-04-13 LAB — POCT PREGNANCY, URINE: Preg Test, Ur: NEGATIVE

## 2019-04-13 MED ORDER — LACTATED RINGERS IV SOLN
INTRAVENOUS | Status: DC
  Administered 2019-04-14: 10:00:00 via INTRAVENOUS

## 2019-04-13 MED ORDER — GABAPENTIN 300 MG PO CAPS: 300.0000 mg | ORAL_CAPSULE | ORAL | Status: AC

## 2019-04-13 MED ORDER — CELECOXIB 400 MG PO CAPS: 400.0000 mg | ORAL_CAPSULE | ORAL | Status: AC

## 2019-04-13 MED ORDER — ACETAMINOPHEN 500 MG PO TABS: 1000.0000 mg | ORAL_TABLET | ORAL | Status: AC

## 2019-04-13 MED ORDER — LACTATED RINGERS IV SOLN: INTRAVENOUS | Status: DC

## 2019-04-13 NOTE — Progress Notes (Signed)

## 2019-04-14 ENCOUNTER — Encounter (HOSPITAL_BASED_OUTPATIENT_CLINIC_OR_DEPARTMENT_OTHER)
Admission: RE | Disposition: A | Discharge status: Home / Self Care | Payer: Self-pay | Source: Home / Self Care | Attending: Family Medicine

## 2019-04-14 ENCOUNTER — Ambulatory Visit (HOSPITAL_BASED_OUTPATIENT_CLINIC_OR_DEPARTMENT_OTHER): Payer: Medicaid Other | Admitting: Anesthesiology

## 2019-04-14 ENCOUNTER — Encounter (HOSPITAL_BASED_OUTPATIENT_CLINIC_OR_DEPARTMENT_OTHER): Payer: Self-pay

## 2019-04-14 ENCOUNTER — Ambulatory Visit (HOSPITAL_BASED_OUTPATIENT_CLINIC_OR_DEPARTMENT_OTHER)
Admission: RE | Admit: 2019-04-14 | Discharge: 2019-04-14 | Disposition: A | Discharge status: Home / Self Care | Payer: Medicaid Other | Attending: Family Medicine | Admitting: Family Medicine

## 2019-04-14 DIAGNOSIS — Z30432 Encounter for removal of intrauterine contraceptive device: Secondary | ICD-10-CM

## 2019-04-14 DIAGNOSIS — Z302 Encounter for sterilization: Secondary | ICD-10-CM | POA: Diagnosis present

## 2019-04-14 DIAGNOSIS — Z9079 Acquired absence of other genital organ(s): Secondary | ICD-10-CM | POA: Insufficient documentation

## 2019-04-14 DIAGNOSIS — Z1379 Encounter for other screening for genetic and chromosomal anomalies: Secondary | ICD-10-CM

## 2019-04-14 DIAGNOSIS — T8383XA Hemorrhage of genitourinary prosthetic devices, implants and grafts, initial encounter: Secondary | ICD-10-CM

## 2019-04-14 DIAGNOSIS — N939 Abnormal uterine and vaginal bleeding, unspecified: Secondary | ICD-10-CM | POA: Diagnosis present

## 2019-04-14 DIAGNOSIS — Z8041 Family history of malignant neoplasm of ovary: Secondary | ICD-10-CM | POA: Diagnosis not present

## 2019-04-14 DIAGNOSIS — Z87891 Personal history of nicotine dependence: Secondary | ICD-10-CM | POA: Diagnosis not present

## 2019-04-14 HISTORY — PX: IUD REMOVAL: SHX5392

## 2019-04-14 HISTORY — PX: LAPAROSCOPIC UNILATERAL SALPINGECTOMY: SHX5934

## 2019-04-14 LAB — ABO/RH: ABO/RH(D): A NEG

## 2019-04-14 SURGERY — SALPINGECTOMY, UNILATERAL, LAPAROSCOPIC
Anesthesia: General

## 2019-04-14 MED ORDER — KETOROLAC TROMETHAMINE 30 MG/ML IJ SOLN
INTRAMUSCULAR | Status: DC | PRN
  Administered 2019-04-14: 30 mg via INTRAVENOUS

## 2019-04-14 MED ORDER — HYDROMORPHONE HCL 1 MG/ML IJ SOLN
INTRAMUSCULAR | Status: AC
  Filled 2019-04-14: qty 0.5

## 2019-04-14 MED ORDER — SCOPOLAMINE 1 MG/3DAYS TD PT72
MEDICATED_PATCH | TRANSDERMAL | Status: AC
  Filled 2019-04-14: qty 1

## 2019-04-14 MED ORDER — ROCURONIUM BROMIDE 100 MG/10ML IV SOLN
INTRAVENOUS | Status: DC | PRN
  Administered 2019-04-14: 50 mg via INTRAVENOUS

## 2019-04-14 MED ORDER — EPHEDRINE 5 MG/ML INJ
INTRAVENOUS | Status: AC
  Filled 2019-04-14: qty 10

## 2019-04-14 MED ORDER — HYDROMORPHONE HCL 1 MG/ML IJ SOLN
0.2500 mg | INTRAMUSCULAR | Status: DC | PRN
  Administered 2019-04-14: 0.5 mg via INTRAVENOUS

## 2019-04-14 MED ORDER — MEPERIDINE HCL 25 MG/ML IJ SOLN: 6.2500 mg | INTRAMUSCULAR | Status: DC | PRN

## 2019-04-14 MED ORDER — CELECOXIB 200 MG PO CAPS
ORAL_CAPSULE | ORAL | Status: AC
  Filled 2019-04-14: qty 2

## 2019-04-14 MED ORDER — DEXAMETHASONE SODIUM PHOSPHATE 4 MG/ML IJ SOLN
INTRAMUSCULAR | Status: DC | PRN
  Administered 2019-04-14: 8 mg via INTRAVENOUS

## 2019-04-14 MED ORDER — FENTANYL CITRATE (PF) 100 MCG/2ML IJ SOLN
INTRAMUSCULAR | Status: AC
  Filled 2019-04-14: qty 2

## 2019-04-14 MED ORDER — CELECOXIB 400 MG PO CAPS
400.0000 mg | ORAL_CAPSULE | ORAL | Status: AC
  Administered 2019-04-14: 400 mg via ORAL

## 2019-04-14 MED ORDER — GABAPENTIN 300 MG PO CAPS
300.0000 mg | ORAL_CAPSULE | ORAL | Status: AC
  Administered 2019-04-14: 300 mg via ORAL

## 2019-04-14 MED ORDER — OXYCODONE HCL 5 MG PO TABS: 5.0000 mg | ORAL_TABLET | Freq: Four times a day (QID) | ORAL | 0 refills | Status: AC | PRN

## 2019-04-14 MED ORDER — ONDANSETRON HCL 4 MG/2ML IJ SOLN
INTRAMUSCULAR | Status: DC | PRN
  Administered 2019-04-14: 4 mg via INTRAVENOUS

## 2019-04-14 MED ORDER — ACETAMINOPHEN 500 MG PO TABS
1000.0000 mg | ORAL_TABLET | ORAL | Status: AC
  Administered 2019-04-14: 1000 mg via ORAL

## 2019-04-14 MED ORDER — OXYCODONE HCL 5 MG PO TABS
ORAL_TABLET | ORAL | Status: AC
  Filled 2019-04-14: qty 1

## 2019-04-14 MED ORDER — PROPOFOL 10 MG/ML IV BOLUS
INTRAVENOUS | Status: AC
  Filled 2019-04-14: qty 20

## 2019-04-14 MED ORDER — LIDOCAINE HCL (CARDIAC) PF 100 MG/5ML IV SOSY
PREFILLED_SYRINGE | INTRAVENOUS | Status: DC | PRN
  Administered 2019-04-14: 50 mg via INTRAVENOUS

## 2019-04-14 MED ORDER — PHENYLEPHRINE 40 MCG/ML (10ML) SYRINGE FOR IV PUSH (FOR BLOOD PRESSURE SUPPORT)
PREFILLED_SYRINGE | INTRAVENOUS | Status: AC
  Filled 2019-04-14: qty 10

## 2019-04-14 MED ORDER — GLYCOPYRROLATE 0.2 MG/ML IJ SOLN
INTRAMUSCULAR | Status: DC | PRN
  Administered 2019-04-14: .2 mg via INTRAVENOUS

## 2019-04-14 MED ORDER — ACETAMINOPHEN 500 MG PO TABS
ORAL_TABLET | ORAL | Status: AC
  Filled 2019-04-14: qty 2

## 2019-04-14 MED ORDER — ONDANSETRON HCL 4 MG/2ML IJ SOLN
INTRAMUSCULAR | Status: AC
  Filled 2019-04-14: qty 2

## 2019-04-14 MED ORDER — MIDAZOLAM HCL 5 MG/5ML IJ SOLN
INTRAMUSCULAR | Status: DC | PRN
  Administered 2019-04-14: 2 mg via INTRAVENOUS

## 2019-04-14 MED ORDER — BUPIVACAINE HCL 0.5 % IJ SOLN
INTRAMUSCULAR | Status: DC | PRN
  Administered 2019-04-14: 7 mL

## 2019-04-14 MED ORDER — GABAPENTIN 300 MG PO CAPS
ORAL_CAPSULE | ORAL | Status: AC
  Filled 2019-04-14: qty 1

## 2019-04-14 MED ORDER — SUGAMMADEX SODIUM 200 MG/2ML IV SOLN
INTRAVENOUS | Status: DC | PRN
  Administered 2019-04-14: 200 mg via INTRAVENOUS

## 2019-04-14 MED ORDER — FENTANYL CITRATE (PF) 100 MCG/2ML IJ SOLN
INTRAMUSCULAR | Status: DC | PRN
  Administered 2019-04-14: 100 ug via INTRAVENOUS
  Administered 2019-04-14: 50 ug via INTRAVENOUS

## 2019-04-14 MED ORDER — PROPOFOL 500 MG/50ML IV EMUL
INTRAVENOUS | Status: AC
  Filled 2019-04-14: qty 50

## 2019-04-14 MED ORDER — OXYCODONE HCL 5 MG/5ML PO SOLN: 5.0000 mg | Freq: Once | ORAL | Status: AC | PRN

## 2019-04-14 MED ORDER — LACTATED RINGERS IV SOLN
INTRAVENOUS | Status: DC
  Administered 2019-04-14: 09:00:00 via INTRAVENOUS

## 2019-04-14 MED ORDER — PROMETHAZINE HCL 25 MG/ML IJ SOLN: 6.2500 mg | INTRAMUSCULAR | Status: DC | PRN

## 2019-04-14 MED ORDER — OXYCODONE HCL 5 MG PO TABS
5.0000 mg | ORAL_TABLET | Freq: Once | ORAL | Status: AC | PRN
  Administered 2019-04-14: 5 mg via ORAL

## 2019-04-14 MED ORDER — EPHEDRINE SULFATE-NACL 50-0.9 MG/10ML-% IV SOSY
PREFILLED_SYRINGE | INTRAVENOUS | Status: DC | PRN
  Administered 2019-04-14: 15 mg via INTRAVENOUS
  Administered 2019-04-14: 10 mg via INTRAVENOUS

## 2019-04-14 MED ORDER — PROPOFOL 10 MG/ML IV BOLUS
INTRAVENOUS | Status: DC | PRN
  Administered 2019-04-14: 140 mg via INTRAVENOUS

## 2019-04-14 MED ORDER — LACTATED RINGERS IV SOLN: INTRAVENOUS | Status: DC

## 2019-04-14 MED ORDER — BUPIVACAINE HCL (PF) 0.5 % IJ SOLN
INTRAMUSCULAR | Status: AC
  Filled 2019-04-14: qty 30

## 2019-04-14 MED ORDER — LIDOCAINE 2% (20 MG/ML) 5 ML SYRINGE
INTRAMUSCULAR | Status: AC
  Filled 2019-04-14: qty 5

## 2019-04-14 MED ORDER — PHENYLEPHRINE 40 MCG/ML (10ML) SYRINGE FOR IV PUSH (FOR BLOOD PRESSURE SUPPORT)
PREFILLED_SYRINGE | INTRAVENOUS | Status: DC | PRN
  Administered 2019-04-14: 80 ug via INTRAVENOUS

## 2019-04-14 MED ORDER — MIDAZOLAM HCL 2 MG/2ML IJ SOLN
INTRAMUSCULAR | Status: AC
  Filled 2019-04-14: qty 2

## 2019-04-14 MED ORDER — DEXAMETHASONE SODIUM PHOSPHATE 10 MG/ML IJ SOLN
INTRAMUSCULAR | Status: AC
  Filled 2019-04-14: qty 1

## 2019-04-14 MED ORDER — SCOPOLAMINE 1 MG/3DAYS TD PT72
MEDICATED_PATCH | TRANSDERMAL | Status: DC | PRN
  Administered 2019-04-14: 1 via TRANSDERMAL

## 2019-04-14 MED ORDER — ATROPINE SULFATE 0.4 MG/ML IJ SOLN
INTRAMUSCULAR | Status: DC | PRN
  Administered 2019-04-14: .1 mg via INTRAVENOUS

## 2019-04-14 SURGICAL SUPPLY — 34 items
BAG SPEC RTRVL LRG 6X4 10 (ENDOMECHANICALS)
BLADE SURG 15 STRL LF DISP TIS (BLADE) ×3 IMPLANT
BLADE SURG 15 STRL SS (BLADE) ×5
BRIEF STRETCH FOR OB PAD XXL (UNDERPADS AND DIAPERS) ×5 IMPLANT
CABLE HIGH FREQUENCY MONO STRZ (ELECTRODE) IMPLANT
CATH ROBINSON RED A/P 16FR (CATHETERS) ×3 IMPLANT
CLOTH BEACON ORANGE TIMEOUT ST (SAFETY) ×5 IMPLANT
DRSG OPSITE POSTOP 3X4 (GAUZE/BANDAGES/DRESSINGS) ×3 IMPLANT
DURAPREP 26ML APPLICATOR (WOUND CARE) ×5 IMPLANT
GAUZE 4X4 16PLY RFD (DISPOSABLE) ×5 IMPLANT
GLOVE BIOGEL PI IND STRL 7.0 (GLOVE) ×12 IMPLANT
GLOVE BIOGEL PI INDICATOR 7.0 (GLOVE) ×8
GLOVE ECLIPSE 7.0 STRL STRAW (GLOVE) ×5 IMPLANT
GOWN STRL REUS W/TWL LRG LVL3 (GOWN DISPOSABLE) ×15 IMPLANT
NS IRRIG 1000ML POUR BTL (IV SOLUTION) ×5 IMPLANT
PACK LAPAROSCOPY BASIN (CUSTOM PROCEDURE TRAY) ×5 IMPLANT
PACK TRENDGUARD 450 HYBRID PRO (MISCELLANEOUS) ×2 IMPLANT
PACK TRENDGUARD 600 HYBRD PROC (MISCELLANEOUS) IMPLANT
PAD OB MATERNITY 4.3X12.25 (PERSONAL CARE ITEMS) ×5 IMPLANT
PAD PREP 24X48 CUFFED NSTRL (MISCELLANEOUS) ×5 IMPLANT
POUCH SPECIMEN RETRIEVAL 10MM (ENDOMECHANICALS) IMPLANT
SET IRRIG TUBING LAPAROSCOPIC (IRRIGATION / IRRIGATOR) IMPLANT
SET TUBE SMOKE EVAC HIGH FLOW (TUBING) ×5 IMPLANT
SHEARS HARMONIC ACE PLUS 36CM (ENDOMECHANICALS) ×3 IMPLANT
SLEEVE SCD COMPRESS KNEE MED (MISCELLANEOUS) ×5 IMPLANT
SUT VIC AB 3-0 X1 27 (SUTURE) ×5 IMPLANT
SUT VICRYL 0 UR6 27IN ABS (SUTURE) ×10 IMPLANT
TOWEL GREEN STERILE FF (TOWEL DISPOSABLE) ×10 IMPLANT
TRAY FOLEY W/BAG SLVR 14FR LF (SET/KITS/TRAYS/PACK) IMPLANT
TRENDGUARD 450 HYBRID PRO PACK (MISCELLANEOUS) ×5
TRENDGUARD 600 HYBRID PROC PK (MISCELLANEOUS)
TROCAR BALLN 12MMX100 BLUNT (TROCAR) ×3 IMPLANT
TROCAR XCEL NON-BLD 5MMX100MML (ENDOMECHANICALS) ×10 IMPLANT
WARMER LAPAROSCOPE (MISCELLANEOUS) ×5 IMPLANT

## 2019-04-14 NOTE — Anesthesia Procedure Notes (Signed)
Procedure Name: Intubation Date/Time: 04/14/2019 10:21 AM Performed by: [place] Reshard Guillet C, CRNA Pre-anesthesia Checklist: Patient identified, Emergency Drugs available, Suction available and Patient being monitored Patient Re-evaluated:Patient Re-evaluated prior to induction Oxygen Delivery Method: Circle system utilized Preoxygenation: Pre-oxygenation with 100% oxygen Induction Type: IV induction Ventilation: Mask ventilation without difficulty Laryngoscope Size: Mac and 3 Grade View: Grade I Tube type: Oral Tube size: 7.0 mm Number of attempts: 1 Airway Equipment and Method: Stylet and Oral airway Placement Confirmation: ETT inserted through vocal cords under direct vision,  positive ETCO2 and breath sounds checked- equal and bilateral Secured at: 20 cm Tube secured with: Tape Dental Injury: Teeth and Oropharynx as per pre-operative assessment

## 2019-04-14 NOTE — Transfer of Care (Signed)
Immediate Anesthesia Transfer of Care Note  Patient: [name] [name]  Procedure(s) Performed: LAPAROSCOPIC UNILATERAL SALPINGECTOMY (Left ) INTRAUTERINE DEVICE (IUD) REMOVAL (N/A )  Patient Location: PACU  Anesthesia Type:General  Level of Consciousness: awake, alert  and oriented  Airway & Oxygen Therapy: Patient Spontanous Breathing and Patient connected to face mask oxygen  Post-op Assessment: Report given to RN and Post -op Vital signs reviewed and stable  Post vital signs: Reviewed and stable  Last Vitals:  Vitals Value Taken Time  BP 124/74 04/14/19 1130  Temp    Pulse 82 04/14/19 1141  Resp 21 04/14/19 1141  SpO2 100 % 04/14/19 1141  Vitals shown include unvalidated device data.  Last Pain:  Vitals:   04/14/19 0858  TempSrc: Oral  PainSc: 0-No pain         Complications: No apparent anesthesia complications

## 2019-04-14 NOTE — Discharge Instructions (Signed)
Laparoscopic Tubal Ligation, Care After °This sheet gives you information about how to care for yourself after your procedure. Your health care provider may also give you more specific instructions. If you have problems or questions, contact your health care provider. °What can I expect after the procedure? °After the procedure, it is common to have: °· A sore throat. °· Discomfort in your shoulder. °· Mild discomfort or cramping in your abdomen. °· Gas pains. °· Pain or soreness in the area where the surgical incision was made. °· A bloated feeling. °· Tiredness. °· Nausea. °· Vomiting. °Follow these instructions at home: °Medicines °· Take over-the-counter and prescription medicines only as told by your health care provider. °· Do not take aspirin because it can cause bleeding. °· Ask your health care provider if the medicine prescribed to you: °? Requires you to avoid driving or using heavy machinery. °? Can cause constipation. You may need to take actions to prevent or treat constipation, such as: °§ Drink enough fluid to keep your urine pale yellow. °§ Take over-the-counter or prescription medicines. °§ Eat foods that are high in fiber, such as beans, whole grains, and fresh fruits and vegetables. °§ Limit foods that are high in fat and processed sugars, such as fried or sweet foods. °Incision care ° °  ° °· Follow instructions from your health care provider about how to take care of your incision. Make sure you: °? Wash your hands with soap and water before and after you change your bandage (dressing). If soap and water are not available, use hand sanitizer. °? Change your dressing as told by your health care provider. °? Leave stitches (sutures), skin glue, or adhesive strips in place. These skin closures may need to stay in place for 2 weeks or longer. If adhesive strip edges start to loosen and curl up, you may trim the loose edges. Do not remove adhesive strips completely unless your health care provider  tells you to do that. °· Check your incision area every day for signs of infection. Check for: °? Redness, swelling, or pain. °? Fluid or blood. °? Warmth. °? Pus or a bad smell. °Activity °· Rest as told by your health care provider. °· Avoid sitting for a long time without moving. Get up to take short walks every 1-2 hours. This is important to improve blood flow and breathing. Ask for help if you feel weak or unsteady. °· Return to your normal activities as told by your health care provider. Ask your health care provider what activities are safe for you. °General instructions °· Do not take baths, swim, or use a hot tub until your health care provider approves. Ask your health care provider if you may take showers. You may only be allowed to take sponge baths. °· Have someone help you with your daily household tasks for the first few days. °· Keep all follow-up visits as told by your health care provider. This is important. °Contact a health care provider if: °· You have redness, swelling, or pain around your incision. °· Your incision feels warm to the touch. °· You have pus or a bad smell coming from your incision. °· The edges of your incision break open after the sutures have been removed. °· Your pain does not improve after 2-3 days. °· You have a rash. °· You repeatedly become dizzy or light-headed. °· Your pain medicine is not helping. °Get help right away if you: °· Have a fever. °· Faint. °· Have increasing   pain in your abdomen.  Have severe pain in one or both of your shoulders.  Have fluid or blood coming from your sutures or from your vagina.  Have shortness of breath or difficulty breathing.  Have chest pain or leg pain.  Have ongoing nausea, vomiting, or diarrhea. Summary  After the procedure, it is common to have mild discomfort or cramping in your abdomen.  Take over-the-counter and prescription medicines only as told by your health care provider.  Watch for symptoms that should  prompt you to call your health care provider.  Keep all follow-up visits as told by your health care provider. This is important. This information is not intended to replace advice given to you by your health care provider. Make sure you discuss any questions you have with your health care provider. Document Released: 12/21/2004 Document Revised: 04/28/2018 Document Reviewed: 04/28/2018 Elsevier Patient Education  [place].    No Tylenol until 3:00 PM on 04/14/2019. No Ibuprofen until 7:00 PM on 04/14/2019.    Post Anesthesia Home Care Instructions  Activity: Get plenty of rest for the remainder of the day. A responsible individual must stay with you for 24 hours following the procedure.  For the next 24 hours, DO NOT: -Drive a car -Paediatric nurse -Drink alcoholic beverages -Take any medication unless instructed by your physician -Make any legal decisions or sign important papers.  Meals: Start with liquid foods such as gelatin or soup. Progress to regular foods as tolerated. Avoid greasy, spicy, heavy foods. If nausea and/or vomiting occur, drink only clear liquids until the nausea and/or vomiting subsides. Call your physician if vomiting continues.  Special Instructions/Symptoms: Your throat may feel dry or sore from the anesthesia or the breathing tube placed in your throat during surgery. If this causes discomfort, gargle with warm salt water. The discomfort should disappear within 24 hours.  If you had a scopolamine patch placed behind your ear for the management of post- operative nausea and/or vomiting:  1. The medication in the patch is effective for 72 hours, after which it should be removed.  Wrap patch in a tissue and discard in the trash. Wash hands thoroughly with soap and water. 2. You may remove the patch earlier than 72 hours if you experience unpleasant side effects which may include dry mouth, dizziness or visual disturbances. 3. Avoid touching the  patch. Wash your hands with soap and water after contact with the patch.

## 2019-04-14 NOTE — Anesthesia Postprocedure Evaluation (Signed)
Anesthesia Post Note  Patient: [name] [name]  Procedure(s) Performed: LAPAROSCOPIC UNILATERAL SALPINGECTOMY (Left ) INTRAUTERINE DEVICE (IUD) REMOVAL (N/A )     Patient location during evaluation: PACU Anesthesia Type: General Level of consciousness: sedated and patient cooperative Pain management: pain level controlled Vital Signs Assessment: post-procedure vital signs reviewed and stable Respiratory status: spontaneous breathing Cardiovascular status: stable Anesthetic complications: no    Last Vitals:  Vitals:   04/14/19 1330 04/14/19 1347  BP: 98/61 98/72  Pulse: 71 64  Resp: 13 16  Temp:  36.7 C  SpO2: 96% 99%    Last Pain:  Vitals:   04/14/19 1440  TempSrc:   PainSc: [place]

## 2019-04-14 NOTE — Op Note (Signed)
PROCEDURE DATE: 04/14/2019  PREOPERATIVE DIAGNOSES: Undesired fertility, heavy bleeding with Cu IUD, familial risk of ovarian cancer  POSTOPERATIVE DIAGNOSES: The same   PROCEDURE: IUD removal, Laparoscopic unilateral left salpingctomy   SURGEON: Dr. Donnamae Jude   ASSISTANT: None  ANESTHESIOLOGIST: Nolon Nations, MD MD - GETT  INDICATIONS: 48 y.o. [id number] who desired permanent sterilization.  FINDINGS: Normal appearing uterus, tube and ovaries, adhesions of omentum to uterus from prior salpingectomy   ESTIMATED BLOOD LOSS: 25 ml   SPECIMENS: left fallopian tube to pathology  COMPLICATIONS: None immediately known   PROCEDURE IN DETAIL: The patient had sequential compression devices applied to her lower extremities while in the preoperative area. She was then taken to the operating room where general anesthesia was administered and was found to be adequate. She was placed in the dorsal lithotomy position, and was prepped and draped in a sterile manner. A Red rubber catheter was inserted into her bladder and drained a clear unknown amount of urine. A speculum was placed inside the vagina, and the cervix grasped with an single tooth tenaculum. IUD removed intact. A Hulka tenaculum was placed through the cervix for uterine manipulation. Attention was then turned to the patient's abdomen where a 11-mm skin incision was made in the umbilicus.  This was carried down to the underlying fascia and peritoneum.  The fascia was tagged with 0 Vicryl suture on a UR-6. Intraperitoneal placement was confirmed and insufflation done. A survey of the patient's pelvis and abdomen revealed the findings above. Two left sided 5-mm lower quadrant ports were then placed under direct visualization. On the right side, the Harmonic device was used to take down filmy adhesions.  On the left side, the tube was separated from the mesosalpinx with the Harmonic device.  The specimen was then removed from the abdomen  through the 11-mm port, under direct visualization. The operative site was surveyed, and found to be hemostatic. No intraoperative injury to other surrounding organs was noted. The abdomen was desufflated and all instruments were then removed from the patient's abdomen. Fascial closure was completed with aforementioned 0 Vicryl on a UR-6. All skin incisions were closed with 3-0 Vicryl subcuticular stitches/Dermabond.   Donnamae Jude MD 04/14/2019 11:14 AM

## 2019-04-14 NOTE — Interval H&P Note (Signed)
History and Physical Interval Note:  04/14/2019 10:00 AM  [name] [name]  has presented today for surgery, with the diagnosis of UNDESIRED FERTILITY.  The various methods of treatment have been discussed with the patient and family. After consideration of risks, benefits and other options for treatment, the patient has consented to  Procedure(s): LAPAROSCOPIC BILATERAL SALPINGECTOMY (Bilateral) INTRAUTERINE DEVICE (IUD) REMOVAL (N/A) as a surgical intervention.  The patient's history has been reviewed, patient examined, no change in status, stable for surgery.  I have reviewed the patient's chart and labs.  Questions were answered to the patient's satisfaction.     Donnamae Jude

## 2019-04-14 NOTE — Anesthesia Preprocedure Evaluation (Signed)
Anesthesia Evaluation  Patient identified by MRN, date of birth, ID band Patient awake    Reviewed: Allergy & Precautions, NPO status , Patient's Chart, lab work & pertinent test results  Airway Mallampati: II  TM Distance: >3 FB Neck ROM: Full    Dental no notable dental hx. (+) Dental Advisory Given   Pulmonary neg pulmonary ROS, former smoker,    Pulmonary exam normal breath sounds clear to auscultation       Cardiovascular negative cardio ROS Normal cardiovascular exam Rhythm:Regular Rate:Normal     Neuro/Psych negative neurological ROS  negative psych ROS   GI/Hepatic negative GI ROS, Neg liver ROS,   Endo/Other  negative endocrine ROS  Renal/GU negative Renal ROS     Musculoskeletal negative musculoskeletal ROS (+)   Abdominal   Peds  Hematology negative hematology ROS (+)   Anesthesia Other Findings   Reproductive/Obstetrics negative OB ROS                             Anesthesia Physical Anesthesia Plan  ASA: II  Anesthesia Plan: General   Post-op Pain Management:    Induction: Intravenous  PONV Risk Score and Plan: 4 or greater and Ondansetron, Dexamethasone, Midazolam, Scopolamine patch - Pre-op and Treatment may vary due to age or medical condition  Airway Management Planned: Oral ETT  Additional Equipment: None  Intra-op Plan:   Post-operative Plan: Extubation in OR  Informed Consent: I have reviewed the patients History and Physical, chart, labs and discussed the procedure including the risks, benefits and alternatives for the proposed anesthesia with the patient or authorized representative who has indicated his/her understanding and acceptance.     Dental advisory given  Plan Discussed with: CRNA  Anesthesia Plan Comments:         Anesthesia Quick Evaluation

## 2019-04-15 ENCOUNTER — Telehealth: Payer: Self-pay | Admitting: Family Medicine

## 2019-04-15 ENCOUNTER — Encounter (HOSPITAL_BASED_OUTPATIENT_CLINIC_OR_DEPARTMENT_OTHER): Payer: Self-pay | Admitting: Family Medicine

## 2019-04-15 LAB — SURGICAL PATHOLOGY

## 2019-04-15 NOTE — Telephone Encounter (Signed)
Called and spoke with pt. Pt is taking a pill every 6 hours. She took 2 pills this morning as the pain was more severe. Pt reports her stomach and shoulder are hurting intermittently. Pt reports pain is better this afternoon.  Pt reports she felt short of breath when she was in pain this morning, not now. Discussed if that continues she will need to go to the ED at [facility] for evaluation.   She reports she is having difficulty passing gas, enc her to drink warm fluids and do some light walking . She can also use some Gas X if she needs it.   Discussed taking Ibuprofen or Tylenol for pain after she completes Oxycodone for pain and that the MD's will not give more Oxycodone. Pt voiced understanding.   Pt to call back with further questions or concerns as needed.

## 2019-04-15 NOTE — Telephone Encounter (Signed)
Patient's husband called in stating his wife is a patient of Dr. Kennon Rounds and she needs a refill on a medications because she is in a lot of pain. Husband instructed that I will put a message in to the nurses and they will reach out to them. He verbalized understanding.

## 2019-05-10 ENCOUNTER — Encounter: Payer: Self-pay | Admitting: Licensed Clinical Social Worker

## 2020-04-10 ENCOUNTER — Other Ambulatory Visit: Payer: Self-pay | Admitting: Nurse Practitioner

## 2020-04-10 DIAGNOSIS — Z23 Encounter for immunization: Secondary | ICD-10-CM | POA: Diagnosis not present

## 2020-04-10 DIAGNOSIS — Z803 Family history of malignant neoplasm of breast: Secondary | ICD-10-CM | POA: Diagnosis not present

## 2020-04-10 DIAGNOSIS — N644 Mastodynia: Secondary | ICD-10-CM | POA: Diagnosis not present

## 2020-04-10 DIAGNOSIS — R928 Other abnormal and inconclusive findings on diagnostic imaging of breast: Secondary | ICD-10-CM | POA: Diagnosis not present

## 2020-04-10 DIAGNOSIS — Z1231 Encounter for screening mammogram for malignant neoplasm of breast: Secondary | ICD-10-CM

## 2020-05-08 DIAGNOSIS — N6489 Other specified disorders of breast: Secondary | ICD-10-CM | POA: Diagnosis not present

## 2020-05-08 DIAGNOSIS — R928 Other abnormal and inconclusive findings on diagnostic imaging of breast: Secondary | ICD-10-CM | POA: Diagnosis not present

## 2020-05-08 DIAGNOSIS — N644 Mastodynia: Secondary | ICD-10-CM | POA: Diagnosis not present

## 2020-05-08 DIAGNOSIS — R921 Mammographic calcification found on diagnostic imaging of breast: Secondary | ICD-10-CM | POA: Diagnosis not present

## 2020-05-10 ENCOUNTER — Other Ambulatory Visit: Payer: Self-pay | Admitting: Physician Assistant

## 2020-05-10 DIAGNOSIS — R9389 Abnormal findings on diagnostic imaging of other specified body structures: Secondary | ICD-10-CM

## 2020-05-16 ENCOUNTER — Ambulatory Visit
Admission: RE | Admit: 2020-05-16 | Discharge: 2020-05-16 | Disposition: A | Discharge status: Home / Self Care | Payer: Medicaid Other | Source: Ambulatory Visit | Attending: Physician Assistant | Admitting: Physician Assistant

## 2020-05-16 ENCOUNTER — Other Ambulatory Visit (HOSPITAL_COMMUNITY): Payer: Self-pay | Admitting: Diagnostic Radiology

## 2020-05-16 ENCOUNTER — Other Ambulatory Visit: Payer: Self-pay

## 2020-05-16 DIAGNOSIS — R928 Other abnormal and inconclusive findings on diagnostic imaging of breast: Secondary | ICD-10-CM | POA: Diagnosis not present

## 2020-05-16 DIAGNOSIS — R9389 Abnormal findings on diagnostic imaging of other specified body structures: Secondary | ICD-10-CM

## 2020-05-16 MED ORDER — GADOBUTROL 1 MMOL/ML IV SOLN
7.0000 mL | Freq: Once | INTRAVENOUS | Status: AC | PRN
  Administered 2020-05-16: 7 mL via INTRAVENOUS

## 2020-05-22 DIAGNOSIS — D241 Benign neoplasm of right breast: Secondary | ICD-10-CM | POA: Diagnosis not present

## 2020-05-22 DIAGNOSIS — N6021 Fibroadenosis of right breast: Secondary | ICD-10-CM | POA: Diagnosis not present

## 2020-06-08 DIAGNOSIS — N6325 Unspecified lump in the left breast, overlapping quadrants: Secondary | ICD-10-CM | POA: Diagnosis not present

## 2020-06-08 DIAGNOSIS — D241 Benign neoplasm of right breast: Secondary | ICD-10-CM | POA: Diagnosis not present

## 2020-06-08 DIAGNOSIS — N6311 Unspecified lump in the right breast, upper outer quadrant: Secondary | ICD-10-CM | POA: Diagnosis not present

## 2020-06-08 DIAGNOSIS — N6021 Fibroadenosis of right breast: Secondary | ICD-10-CM | POA: Diagnosis not present

## 2020-06-12 DIAGNOSIS — D0501 Lobular carcinoma in situ of right breast: Secondary | ICD-10-CM | POA: Diagnosis not present

## 2020-06-12 DIAGNOSIS — N6310 Unspecified lump in the right breast, unspecified quadrant: Secondary | ICD-10-CM | POA: Diagnosis not present

## 2020-06-12 DIAGNOSIS — D241 Benign neoplasm of right breast: Secondary | ICD-10-CM | POA: Diagnosis not present

## 2020-06-12 DIAGNOSIS — N6311 Unspecified lump in the right breast, upper outer quadrant: Secondary | ICD-10-CM | POA: Diagnosis not present

## 2020-06-26 DIAGNOSIS — Z803 Family history of malignant neoplasm of breast: Secondary | ICD-10-CM | POA: Diagnosis not present

## 2020-06-26 DIAGNOSIS — D0501 Lobular carcinoma in situ of right breast: Secondary | ICD-10-CM | POA: Diagnosis not present

## 2020-06-26 DIAGNOSIS — Z483 Aftercare following surgery for neoplasm: Secondary | ICD-10-CM | POA: Diagnosis not present

## 2020-06-27 DIAGNOSIS — R5383 Other fatigue: Secondary | ICD-10-CM | POA: Diagnosis not present

## 2020-06-27 DIAGNOSIS — Z20822 Contact with and (suspected) exposure to covid-19: Secondary | ICD-10-CM | POA: Diagnosis not present

## 2020-06-27 DIAGNOSIS — R55 Syncope and collapse: Secondary | ICD-10-CM | POA: Diagnosis not present

## 2020-07-28 IMAGING — US US PELVIS COMPLETE WITH TRANSVAGINAL
1 series · 15 of 25 positions shown · non-contrast
Comparison: 11/26/2018

CLINICAL DATA: RIGHT ovarian cyst, follow-up

EXAM:
TRANSABDOMINAL AND TRANSVAGINAL ULTRASOUND OF PELVIS
TECHNIQUE: Both transabdominal and transvaginal ultrasound examinations of the
pelvis were performed. Transabdominal technique was performed for
global imaging of the pelvis including uterus, ovaries, adnexal
regions, and pelvic cul-de-sac. It was necessary to proceed with
endovaginal exam following the transabdominal exam to visualize the
LEFT ovary.

[Series 1: us pelvis complete with transvaginal · 15 of 84 slices shown]
[im 1/84]
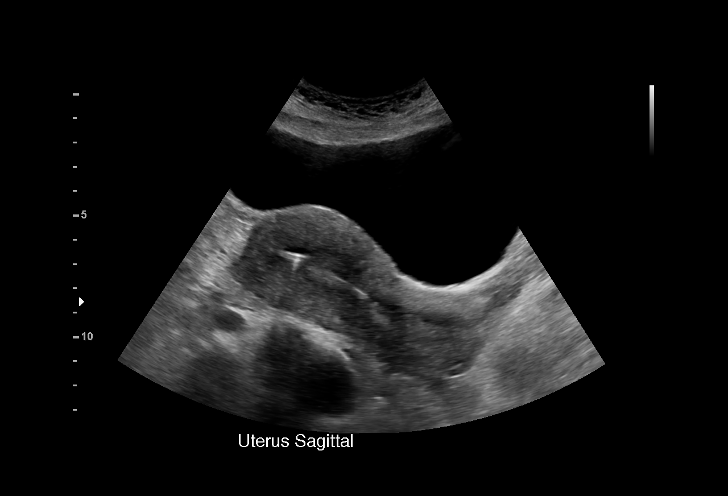
[im 7/84]
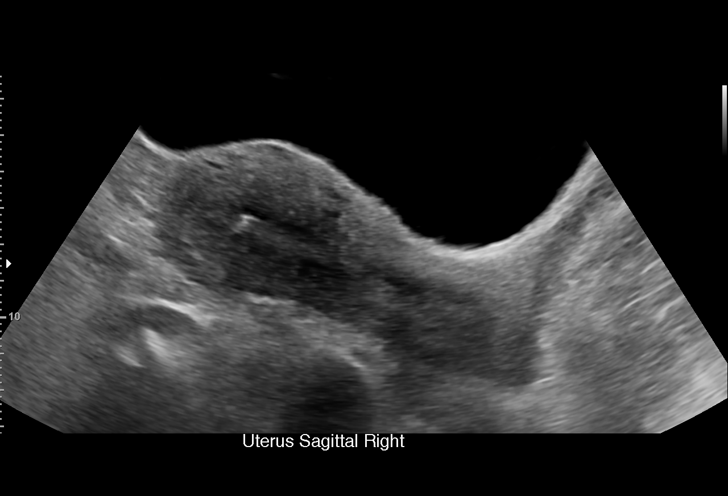
[im 14/84]
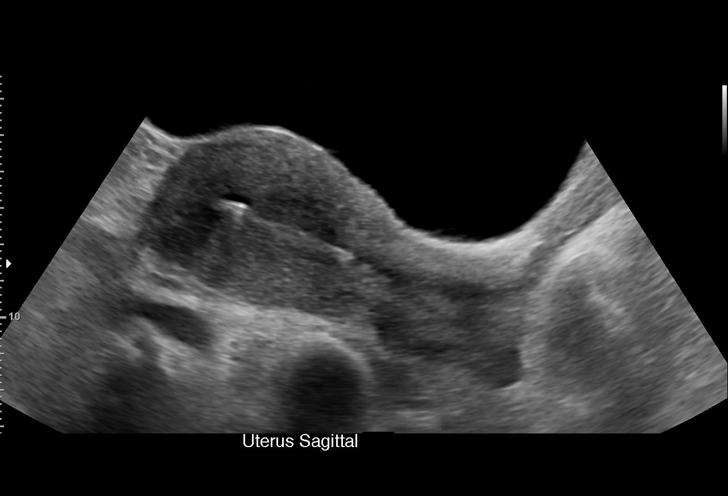
[im 18/84]
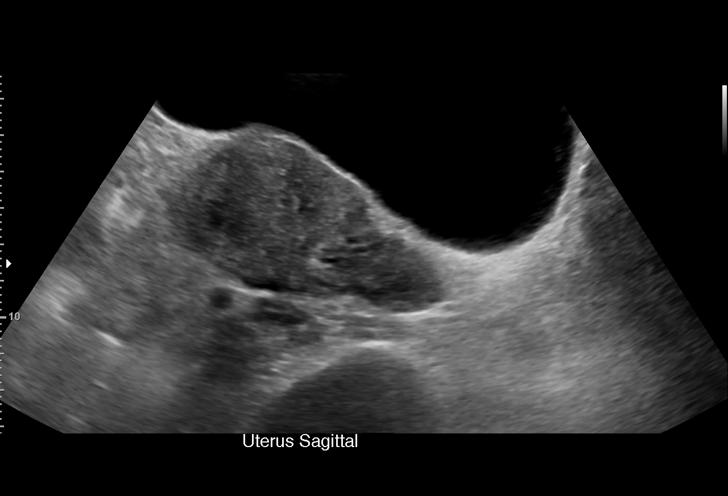
[im 25/84]
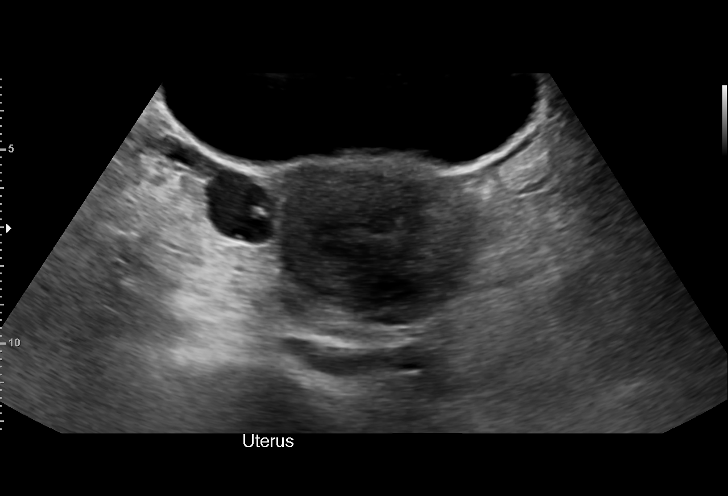
[im 32/84]
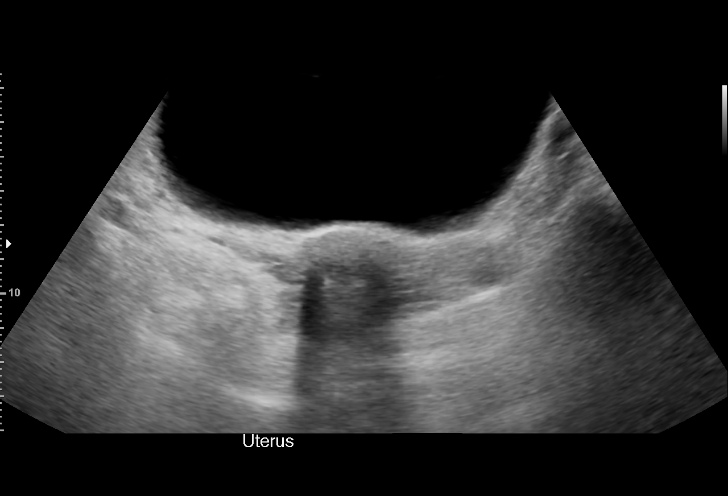
[im 35/84]
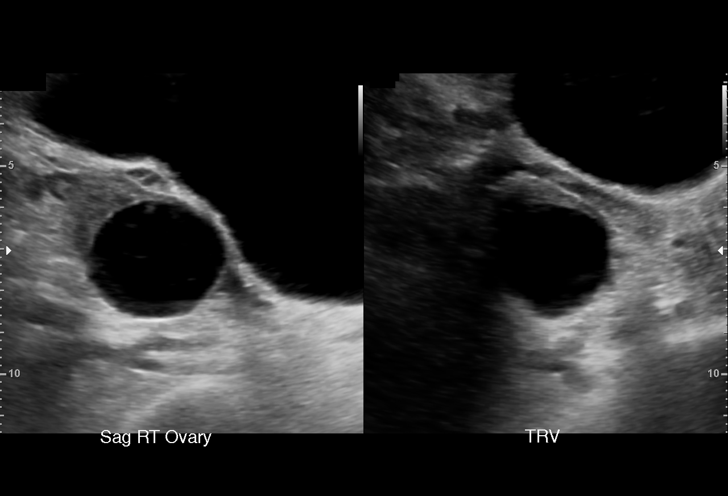
[im 42/84]
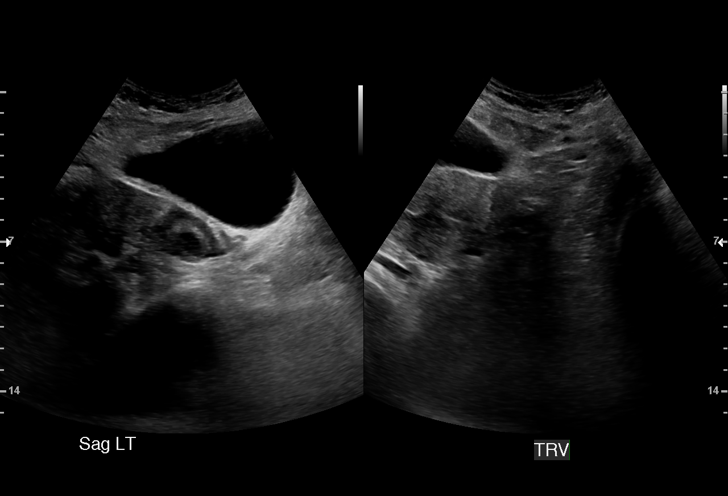
[im 49/84]
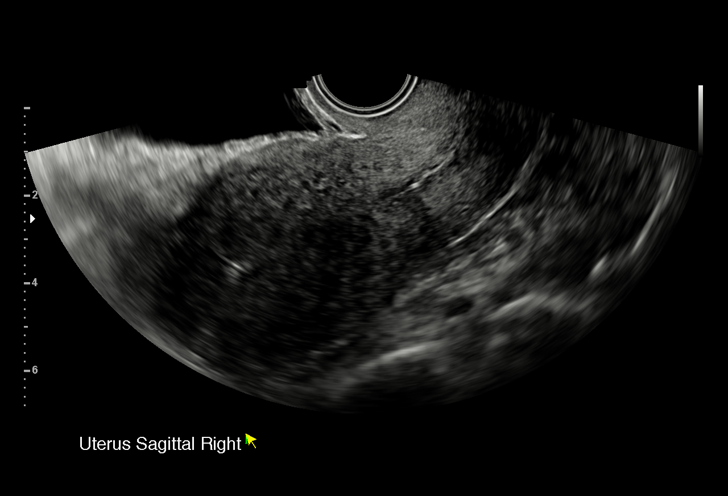
[im 52/84]
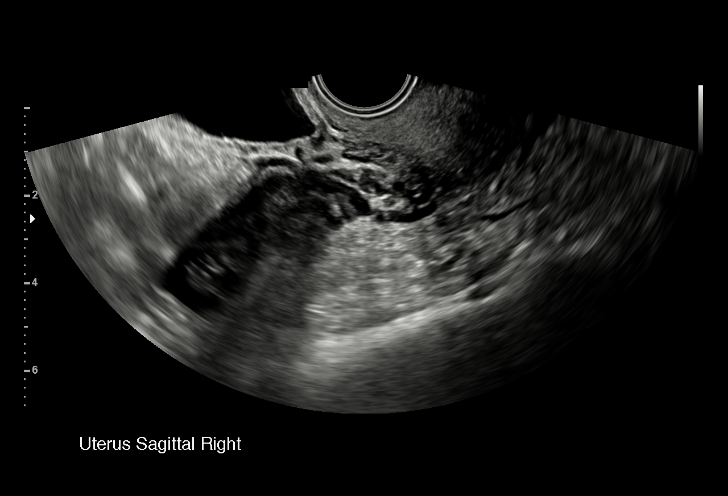
[im 59/84]
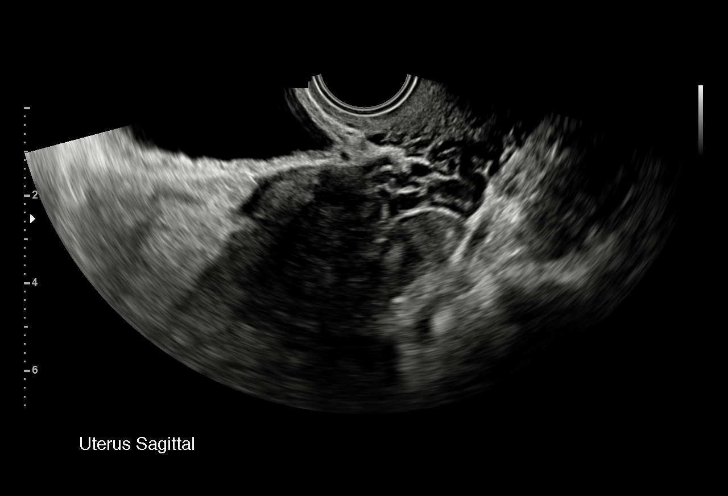
[im 66/84]
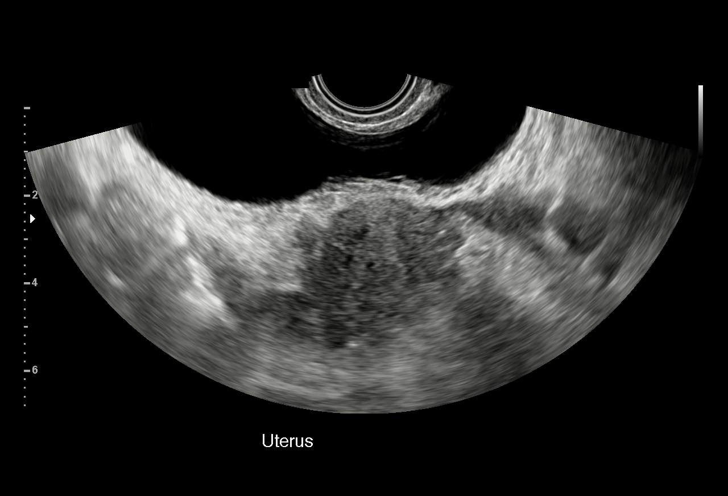
[im 70/84]
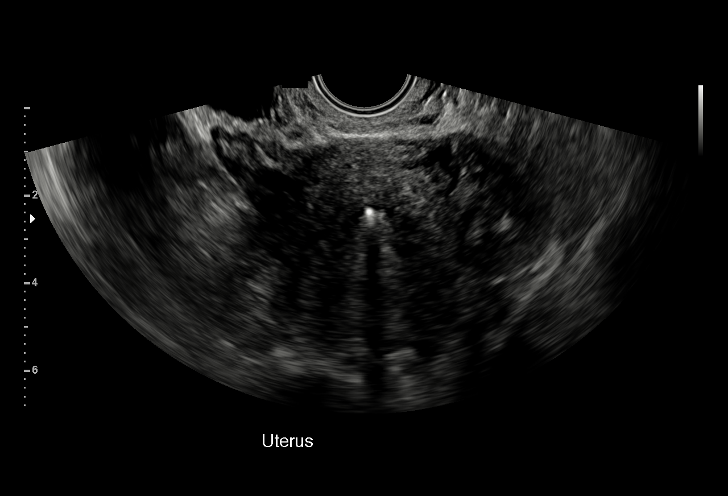
[im 77/84]
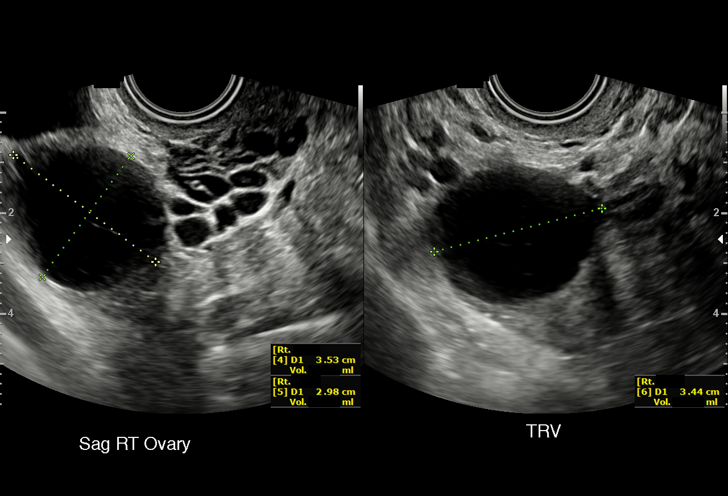
[im 84/84]
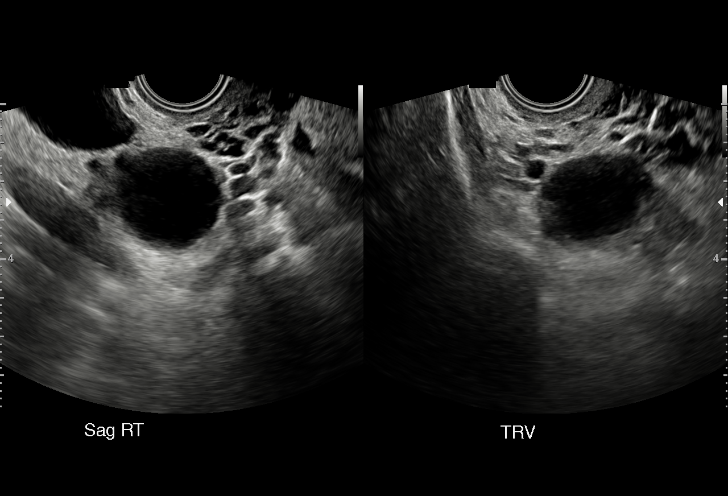

[15 of 25 positions shown; findings below may reference images not displayed]

FINDINGS: Uterus

Measurements: 10.6 x 4.7 x 6.0 cm = volume: 158 mL. Anteverted.
Normal morphology without mass

Endometrium

Thickness: 9 mm. IUD in expected position at the upper uterine
segment. No endometrial fluid or other focal abnormality

Right ovary

Measurements: 5.4 x 3.2 x 3.3 cm = volume: 30.0 mL. Minimally
complicated cyst of the RIGHT ovary 3.1 x 3.0 x 3.4 cm, containing
minimal internal echogenicity. No additional RIGHT ovarian masses. A

Left ovary

Measurements: 3.3 x 1.7 x 2.1 cm = volume: 6.3 mL. Small ruptured
follicle without additional mass

Other findings

No free pelvic fluid or other adnexal masses.
IMPRESSION: Minimally complicated cyst of the RIGHT ovary, 3.4 cm in greatest
size and containing minimal internal echogenicity/debris, decreased
from the 4.1 cm diameter on the previous exam.

IUD within expected position at the upper uterine segment.

## 2020-11-09 DIAGNOSIS — R590 Localized enlarged lymph nodes: Secondary | ICD-10-CM | POA: Diagnosis not present

## 2020-11-09 DIAGNOSIS — D3500 Benign neoplasm of unspecified adrenal gland: Secondary | ICD-10-CM | POA: Diagnosis not present

## 2020-11-09 DIAGNOSIS — R1031 Right lower quadrant pain: Secondary | ICD-10-CM | POA: Diagnosis not present

## 2020-11-09 DIAGNOSIS — D3502 Benign neoplasm of left adrenal gland: Secondary | ICD-10-CM | POA: Diagnosis not present

## 2020-11-16 DIAGNOSIS — R102 Pelvic and perineal pain: Secondary | ICD-10-CM | POA: Diagnosis not present

## 2020-11-16 DIAGNOSIS — R1031 Right lower quadrant pain: Secondary | ICD-10-CM | POA: Diagnosis not present

## 2020-12-09 DIAGNOSIS — J0141 Acute recurrent pansinusitis: Secondary | ICD-10-CM | POA: Diagnosis not present

## 2020-12-09 DIAGNOSIS — J060 Acute laryngopharyngitis: Secondary | ICD-10-CM | POA: Diagnosis not present

## 2020-12-09 DIAGNOSIS — J208 Acute bronchitis due to other specified organisms: Secondary | ICD-10-CM | POA: Diagnosis not present

## 2020-12-09 DIAGNOSIS — B9689 Other specified bacterial agents as the cause of diseases classified elsewhere: Secondary | ICD-10-CM | POA: Diagnosis not present

## 2020-12-11 ENCOUNTER — Other Ambulatory Visit: Payer: Self-pay

## 2020-12-11 ENCOUNTER — Encounter (HOSPITAL_BASED_OUTPATIENT_CLINIC_OR_DEPARTMENT_OTHER): Payer: Self-pay | Admitting: Emergency Medicine

## 2020-12-11 ENCOUNTER — Emergency Department (HOSPITAL_BASED_OUTPATIENT_CLINIC_OR_DEPARTMENT_OTHER): Payer: Medicaid Other

## 2020-12-11 ENCOUNTER — Emergency Department (HOSPITAL_BASED_OUTPATIENT_CLINIC_OR_DEPARTMENT_OTHER)
Admission: EM | Admit: 2020-12-11 | Discharge: 2020-12-11 | Disposition: A | Discharge status: Home / Self Care | Payer: Medicaid Other | Attending: Emergency Medicine | Admitting: Emergency Medicine

## 2020-12-11 DIAGNOSIS — F17200 Nicotine dependence, unspecified, uncomplicated: Secondary | ICD-10-CM | POA: Diagnosis not present

## 2020-12-11 DIAGNOSIS — R0789 Other chest pain: Secondary | ICD-10-CM | POA: Insufficient documentation

## 2020-12-11 DIAGNOSIS — R059 Cough, unspecified: Secondary | ICD-10-CM | POA: Diagnosis not present

## 2020-12-11 DIAGNOSIS — R509 Fever, unspecified: Secondary | ICD-10-CM | POA: Insufficient documentation

## 2020-12-11 DIAGNOSIS — R079 Chest pain, unspecified: Secondary | ICD-10-CM | POA: Diagnosis not present

## 2020-12-11 LAB — BASIC METABOLIC PANEL
Anion gap: 9 (ref 5–15)
BUN: 20 mg/dL (ref 6–20)
CO2: 25 mmol/L (ref 22–32)
Calcium: 9.2 mg/dL (ref 8.9–10.3)
Chloride: 103 mmol/L (ref 98–111)
Creatinine, Ser: 0.61 mg/dL (ref 0.44–1.00)
GFR, Estimated: >60 mL/min (ref >60)
Glucose, Bld: 124 mg/dL — ABNORMAL HIGH (ref 70–99)
Potassium: 3.7 mmol/L (ref 3.5–5.1)
Sodium: 137 mmol/L (ref 135–145)

## 2020-12-11 LAB — CBC WITH DIFFERENTIAL/PLATELET
Abs Immature Granulocytes: 0.08 10*3/uL — ABNORMAL HIGH (ref 0.00–0.07)
Basophils Absolute: 0 10*3/uL (ref 0.0–0.1)
Basophils Relative: 0 %
Eosinophils Absolute: 0 10*3/uL (ref 0.0–0.5)
Eosinophils Relative: 0 %
HCT: 34.9 % — ABNORMAL LOW (ref 36.0–46.0)
Hemoglobin: 11.8 g/dL — ABNORMAL LOW (ref 12.0–15.0)
Immature Granulocytes: 1 %
Lymphocytes Relative: 10 %
Lymphs Abs: 1.1 10*3/uL (ref 0.7–4.0)
MCH: 30.4 pg (ref 26.0–34.0)
MCHC: 33.8 g/dL (ref 30.0–36.0)
MCV: 89.9 fL (ref 80.0–100.0)
Monocytes Absolute: 0.6 10*3/uL (ref 0.1–1.0)
Monocytes Relative: 5 %
Neutro Abs: 9.7 10*3/uL — ABNORMAL HIGH (ref 1.7–7.7)
Neutrophils Relative %: 84 %
Platelets: 287 10*3/uL (ref 150–400)
RBC: 3.88 MIL/uL (ref 3.87–5.11)
RDW: 12.7 % (ref 11.5–15.5)
WBC: 11.5 10*3/uL — ABNORMAL HIGH (ref 4.0–10.5)
nRBC: 0 % (ref 0.0–0.2)

## 2020-12-11 LAB — TROPONIN I (HIGH SENSITIVITY): Troponin I (High Sensitivity): <2 ng/L (ref <18)

## 2020-12-11 LAB — D-DIMER, QUANTITATIVE: D-Dimer, Quant: <0.27 ug/mL-FEU (ref 0.00–0.50)

## 2020-12-11 NOTE — Discharge Instructions (Addendum)
Your chest x-ray did not show an obvious pneumonia.  Your blood work was not consistent with a heart attack or blood clot in the lung.  Most likely you have pain in your chest when you cough because you have strained the muscles in your chest wall.  He can take Tylenol and/or ibuprofen for this.  Please follow-up with your family doctor in the office.

## 2020-12-11 NOTE — ED Provider Notes (Signed)
[facility] EMERGENCY DEPARTMENT Provider Note   CSN: [id number] Arrival date & time: 12/11/20  1659     History Chief Complaint  Patient presents with   Chest Pain    [name] [name] is a 50 y.o. female.  49 yo F with a cc of cough and right sided chest pain.  Going on for the past couple days.  Seen at urgent care and started on abx.  Coughing and having worsening R sided chest pain.  Worse with movement coughing.    No hx of PE/DVT but does endorse some hemoptysis.    No hx of acs.    The history is provided by the patient and the spouse.  Chest Pain Pain location:  R chest Pain quality: sharp and shooting   Pain radiates to:  Does not radiate Pain severity:  Moderate Onset quality:  Gradual Duration:  2 days Timing:  Constant Progression:  Worsening Chronicity:  New Relieved by:  Nothing Worsened by:  Nothing Ineffective treatments:  None tried Associated symptoms: cough and fever   Associated symptoms: no dizziness, no headache, no nausea, no palpitations, no shortness of breath and no vomiting       Past Medical History:  Diagnosis Date   Family history of brain cancer    Family history of breast cancer    Family history of ovarian cancer     Patient Active Problem List   Diagnosis Date Noted   Cyst of right ovary 02/08/2019   Abnormal uterine bleeding 02/08/2019   Encounter for sterilization 02/08/2019   Genetic testing 01/05/2019   Family history of breast cancer    Family history of ovarian cancer    Family history of brain cancer    IUD check up 10/24/2011    Past Surgical History:  Procedure Laterality Date   BACK SURGERY     BIOPSY BREAST Left 2016   IUD REMOVAL N/A 04/14/2019   Procedure: INTRAUTERINE DEVICE (IUD) REMOVAL;  Surgeon: Reva Bores, MD;  Location: [facility];  Service: Gynecology;  Laterality: N/A;   LAPAROSCOPIC UNILATERAL SALPINGECTOMY Left 04/14/2019   Procedure: LAPAROSCOPIC UNILATERAL  SALPINGECTOMY;  Surgeon: Reva Bores, MD;  Location: [facility];  Service: Gynecology;  Laterality: Left;     OB History     Gravida  4   Para  4   Term  4   Preterm  0   AB  0   Living  4      SAB  0   IAB  0   Ectopic  0   Multiple  0   Live Births              Family History  Problem Relation Age of Onset   Diabetes Mother    Breast cancer Mother 13   Heart disease Father    Leukemia Maternal Uncle    Bone cancer Maternal Uncle    Breast cancer Sister 37   Ovarian cancer Maternal Aunt        dx 40s   Ovarian cancer Paternal Aunt        d. 72   Brain cancer Paternal Uncle    Breast cancer Paternal Aunt        d. 54    Social History   Tobacco Use   Smoking status: Some Days    Pack years: 0.00   Smokeless tobacco: Never   Tobacco comments:    once in awhile  Substance  Use Topics   Alcohol use: No   Drug use: No    Home Medications Prior to Admission medications   Medication Sig Start Date End Date Taking? Authorizing Provider  ibuprofen (ADVIL) 200 MG tablet Take 200 mg by mouth every 6 (six) hours as needed.    [provider]  Multiple Vitamin (MULTIVITAMIN) capsule Take 1 capsule by mouth daily.    [provider]  oxyCODONE (OXY IR/ROXICODONE) 5 MG immediate release tablet Take 1 tablet (5 mg total) by mouth every 6 (six) hours as needed for severe pain. 04/14/19   Reva Bores, MD    Allergies    Shellfish allergy  Review of Systems   Review of Systems  Constitutional:  Positive for chills and fever.  HENT:  Negative for congestion and rhinorrhea.   Eyes:  Negative for redness and visual disturbance.  Respiratory:  Positive for cough. Negative for shortness of breath and wheezing.   Cardiovascular:  Positive for chest pain. Negative for palpitations.  Gastrointestinal:  Negative for nausea and vomiting.  Genitourinary:  Negative for dysuria and urgency.  Musculoskeletal:  Negative for  arthralgias and myalgias.  Skin:  Negative for pallor and wound.  Neurological:  Negative for dizziness and headaches.   Physical Exam Updated Vital Signs BP 107/66 (BP Location: Right Arm)   Pulse 71   Temp 98.4 F (36.9 C) (Oral)   Resp 20   Ht 5\' 1"  (1.549 m)   Wt 72.6 kg   SpO2 97%   BMI 30.23 kg/m   Physical Exam Vitals and nursing note reviewed.  Constitutional:      General: She is not in acute distress.    Appearance: She is well-developed. She is not diaphoretic.  HENT:     Head: Normocephalic and atraumatic.  Eyes:     Pupils: Pupils are equal, round, and reactive to light.  Cardiovascular:     Rate and Rhythm: Normal rate and regular rhythm.     Heart sounds: No murmur heard.   No friction rub. No gallop.  Pulmonary:     Effort: Pulmonary effort is normal.     Breath sounds: No wheezing or rales.  Chest:     Chest wall: Tenderness present.  Abdominal:     General: There is no distension.     Palpations: Abdomen is soft.     Tenderness: There is no abdominal tenderness.  Musculoskeletal:        General: No tenderness.     Cervical back: Normal range of motion and neck supple.  Skin:    General: Skin is warm and dry.  Neurological:     Mental Status: She is alert and oriented to person, place, and time.  Psychiatric:        Behavior: Behavior normal.    ED Results / Procedures / Treatments   Labs (all labs ordered are listed, but only abnormal results are displayed) Labs Reviewed  CBC WITH DIFFERENTIAL/PLATELET - Abnormal; Notable for the following components:      Result Value   WBC 11.5 (*)    Hemoglobin 11.8 (*)    HCT 34.9 (*)    Neutro Abs 9.7 (*)    Abs Immature Granulocytes 0.08 (*)    All other components within normal limits  BASIC METABOLIC PANEL - Abnormal; Notable for the following components:   Glucose, Bld 124 (*)    All other components within normal limits  D-DIMER, QUANTITATIVE  TROPONIN I (HIGH SENSITIVITY)  EKG EKG  Interpretation  Date/Time:  Monday December 11 2020 17:17:16 EDT Ventricular Rate:  72 PR Interval:  136 QRS Duration: 68 QT Interval:  366 QTC Calculation: 400 R Axis:   57 Text Interpretation: Normal sinus rhythm Normal ECG No significant change since last tracing Confirmed by Melene Plan [phone]) on 12/11/2020 5:25:19 PM  Radiology DG Chest Port 1 View  Result Date: 12/11/2020 CLINICAL DATA:  50 year old female cough and chest pain. EXAM: PORTABLE CHEST 1 VIEW COMPARISON:  Chest radiograph dated 12/23/2008. FINDINGS: The lungs are clear. There is no pleural effusion pneumothorax. The cardiac silhouette is within limits. No acute osseous pathology. Partially visualized lower cervical ACDF. IMPRESSION: No active disease. Electronically Signed   By: Elgie Collard M.D.   On: 12/11/2020 18:20    Procedures Procedures   Medications Ordered in ED Medications - No data to display  ED Course  I have reviewed the triage vital signs and the nursing notes.  Pertinent labs & imaging results that were available during my care of the patient were reviewed by me and considered in my medical decision making (see chart for details).    MDM Rules/Calculators/A&P                          50 yo F with a chief complaints of cough and right-sided chest pain.  Going on for a few days now.  The patient is worried that she might have worsening pneumonia because now she is having pain in her chest.  She does endorse coughing up blood at 1 point.  We will send a D-dimer.  Blood work.  Reassess.  Ddimer negative, troponin negative with 3 days of symptoms I do not feel I need to repeat 1.  Mild leukocytosis.  Chest x-ray viewed by me without focal infiltrate or pneumothorax.  At this point we will discharge the patient home.  Have her follow-up with her family doctor.  7:21 PM:  I have discussed the diagnosis/risks/treatment options with the patient and family and believe the pt to be eligible for discharge home  to follow-up with PCP. We also discussed returning to the ED immediately if new or worsening sx occur. We discussed the sx which are most concerning (e.g., sudden worsening pain, fever, inability to tolerate by mouth) that necessitate immediate return. Medications administered to the patient during their visit and any new prescriptions provided to the patient are listed below.  Medications given during this visit Medications - No data to display   The patient appears reasonably screen and/or stabilized for discharge and I doubt any other medical condition or other [facility] requiring further screening, evaluation, or treatment in the ED at this time prior to discharge.   Final Clinical Impression(s) / ED Diagnoses Final diagnoses:  Nonspecific chest pain    Rx / DC Orders ED Discharge Orders     None        Melene Plan, DO 12/11/20 1921

## 2020-12-11 NOTE — ED Triage Notes (Signed)
Pt presents to ED Pov. Pt c/o sharp CP from R chest radiating to back. Pt reports that she was diagnosed with pneumonia on Friday. Given antibiotics and taking them compliantly.

## 2020-12-12 DIAGNOSIS — J208 Acute bronchitis due to other specified organisms: Secondary | ICD-10-CM | POA: Diagnosis not present

## 2020-12-12 DIAGNOSIS — R059 Cough, unspecified: Secondary | ICD-10-CM | POA: Diagnosis not present

## 2020-12-12 DIAGNOSIS — B9689 Other specified bacterial agents as the cause of diseases classified elsewhere: Secondary | ICD-10-CM | POA: Diagnosis not present

## 2021-03-14 DIAGNOSIS — Z23 Encounter for immunization: Secondary | ICD-10-CM | POA: Diagnosis not present

## 2021-03-22 DIAGNOSIS — Z803 Family history of malignant neoplasm of breast: Secondary | ICD-10-CM | POA: Diagnosis not present

## 2021-03-22 DIAGNOSIS — Z124 Encounter for screening for malignant neoplasm of cervix: Secondary | ICD-10-CM | POA: Diagnosis not present

## 2021-03-22 DIAGNOSIS — Z1151 Encounter for screening for human papillomavirus (HPV): Secondary | ICD-10-CM | POA: Diagnosis not present

## 2021-03-22 DIAGNOSIS — Z0000 Encounter for general adult medical examination without abnormal findings: Secondary | ICD-10-CM | POA: Diagnosis not present

## 2021-03-22 DIAGNOSIS — Z13220 Encounter for screening for lipoid disorders: Secondary | ICD-10-CM | POA: Diagnosis not present

## 2021-03-22 DIAGNOSIS — Z23 Encounter for immunization: Secondary | ICD-10-CM | POA: Diagnosis not present

## 2021-07-05 DIAGNOSIS — L905 Scar conditions and fibrosis of skin: Secondary | ICD-10-CM | POA: Diagnosis not present

## 2021-07-05 DIAGNOSIS — R922 Inconclusive mammogram: Secondary | ICD-10-CM | POA: Diagnosis not present

## 2021-07-05 DIAGNOSIS — Z1231 Encounter for screening mammogram for malignant neoplasm of breast: Secondary | ICD-10-CM | POA: Diagnosis not present

## 2021-07-05 DIAGNOSIS — N644 Mastodynia: Secondary | ICD-10-CM | POA: Diagnosis not present

## 2021-09-13 DIAGNOSIS — M25561 Pain in right knee: Secondary | ICD-10-CM | POA: Diagnosis not present

## 2021-09-14 DIAGNOSIS — M25561 Pain in right knee: Secondary | ICD-10-CM | POA: Diagnosis not present

## 2021-10-26 IMAGING — MG MM BREAST LOCALIZATION CLIP
3 series · 3 of 7 positions shown · non-contrast
Comparison: Previous exam(s).

CLINICAL DATA: Evaluate CYLINDER biopsy marking clip following MR
guided RIGHT breast biopsy.

EXAM:
DIAGNOSTIC RIGHT MAMMOGRAM POST MRI BIOPSY

[R ML synth-2D]
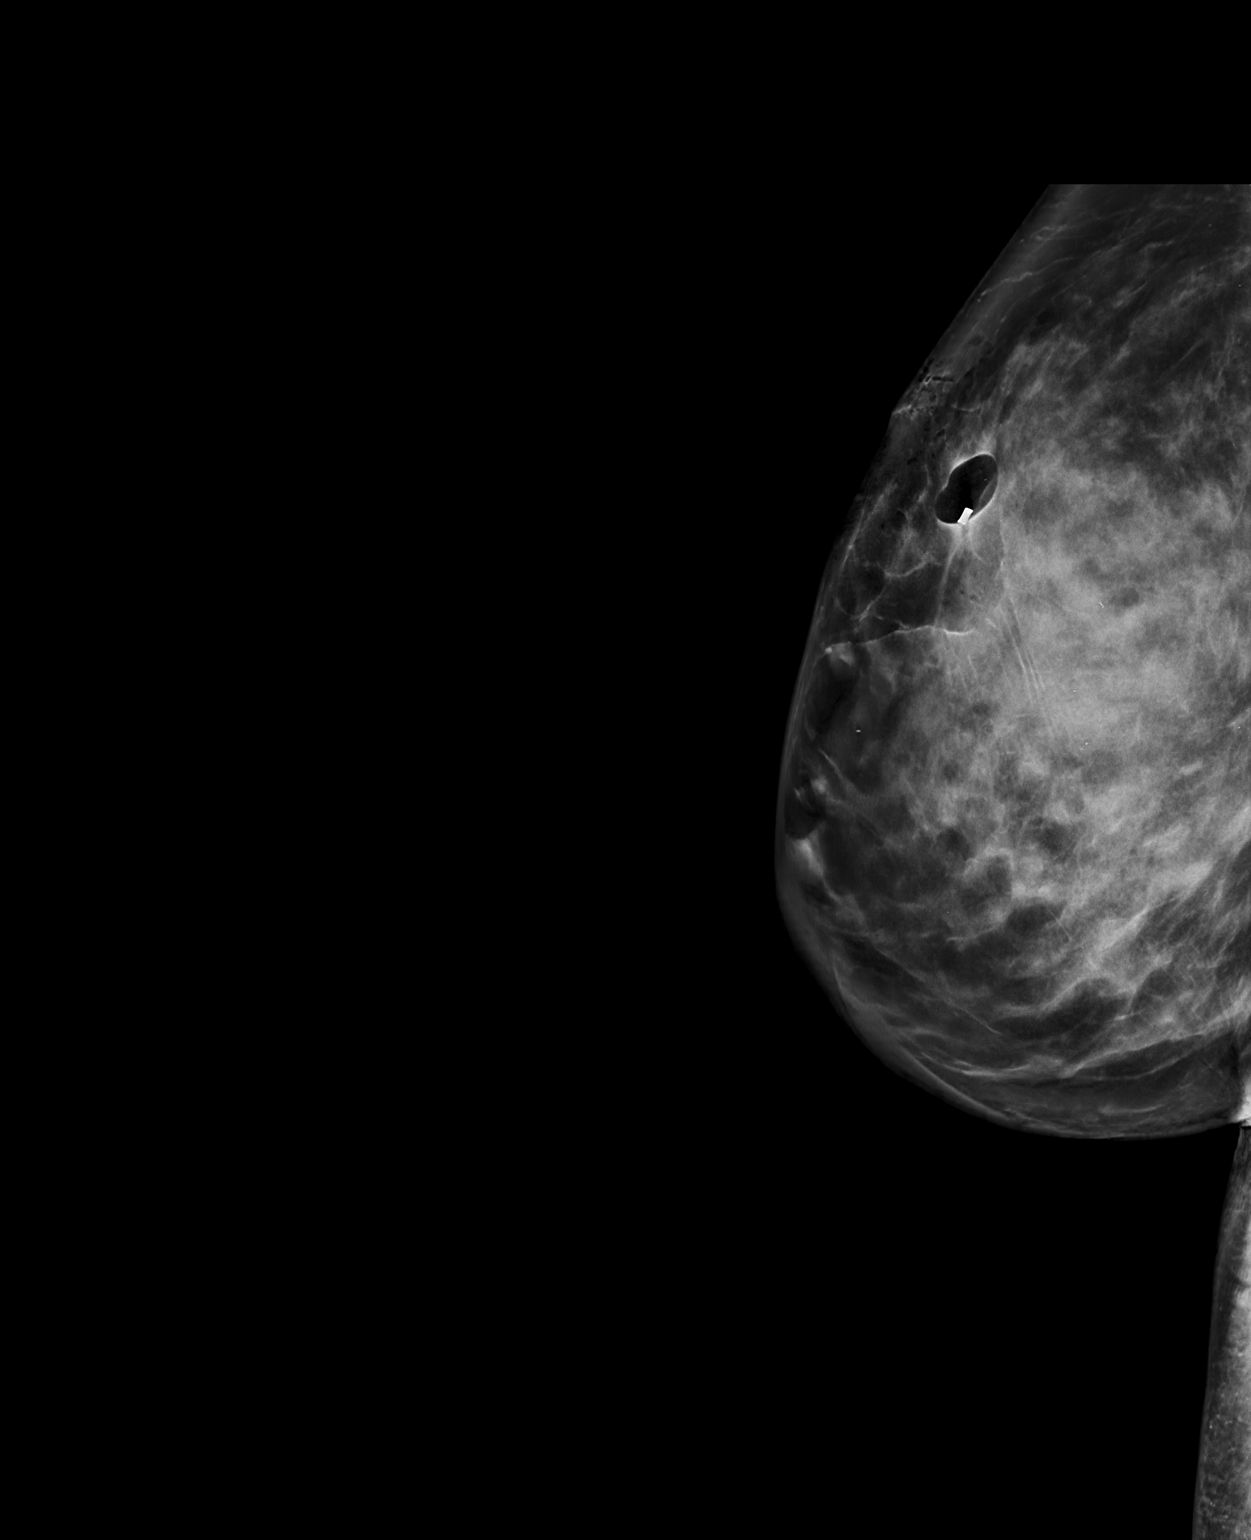

[R CC synth-2D]
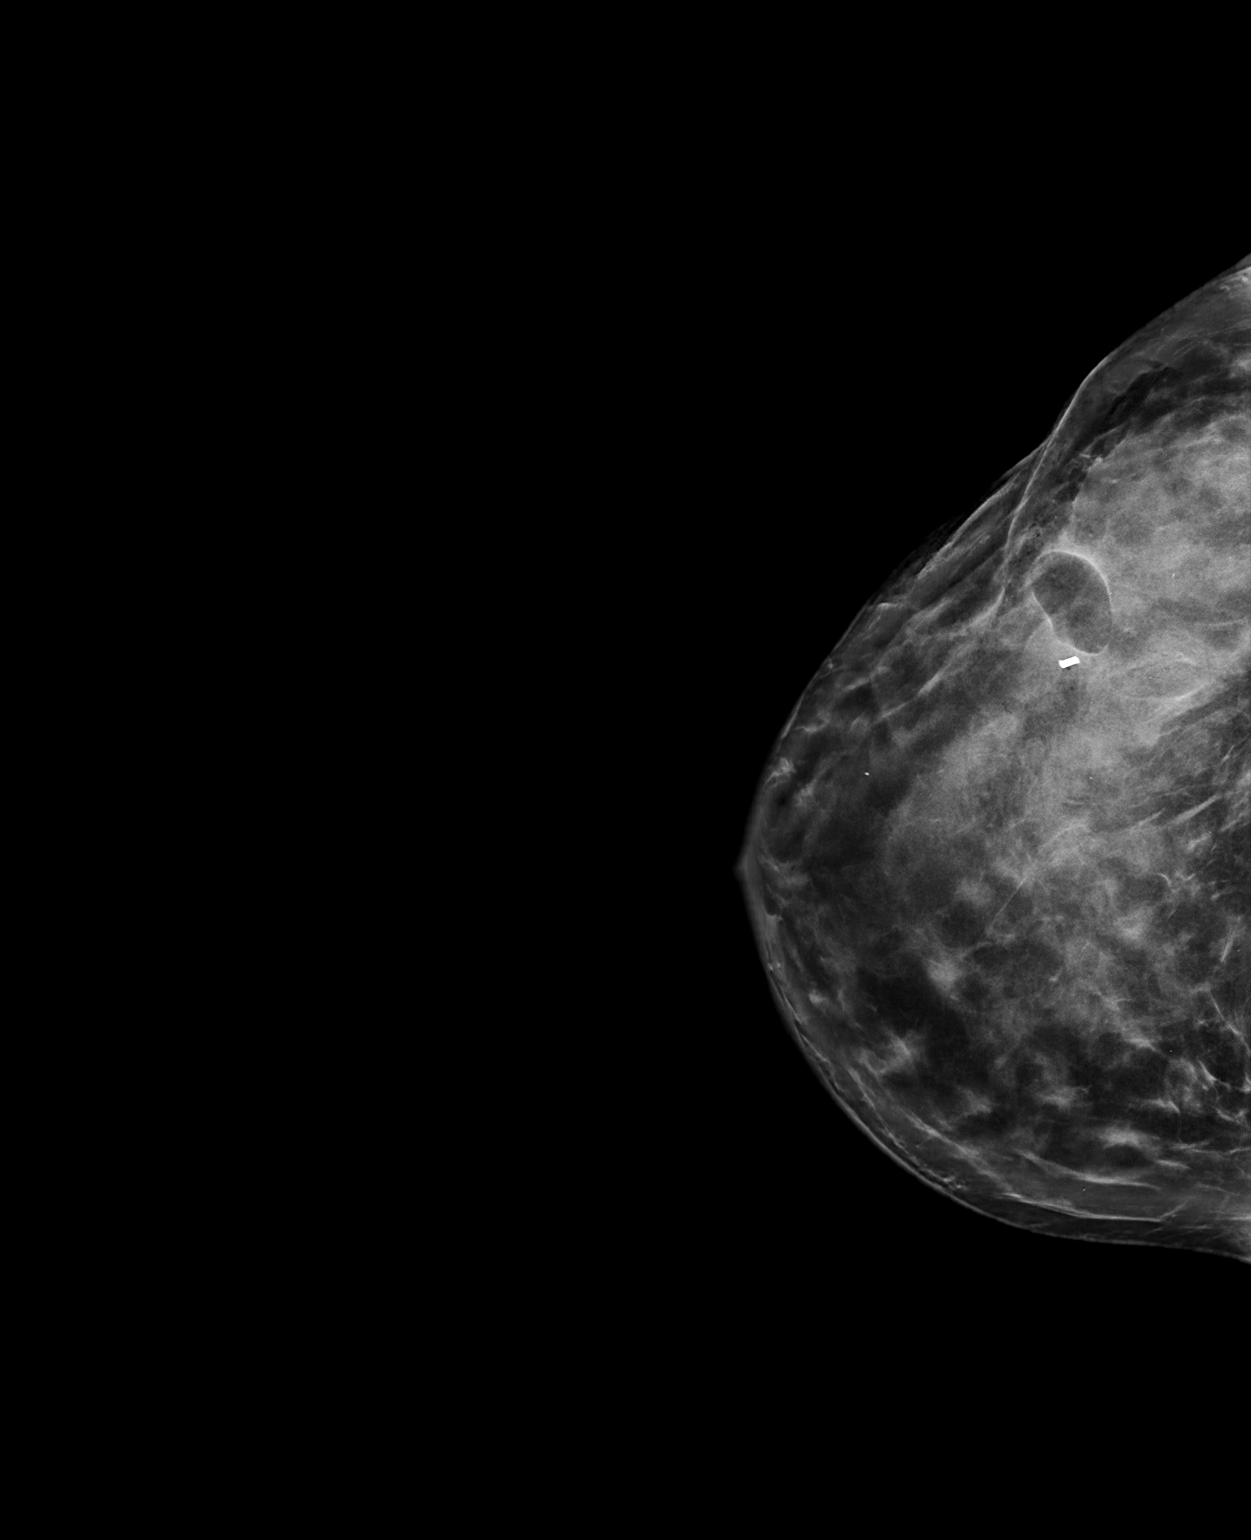

[R ML tomo · tomo slice 46/91.0]
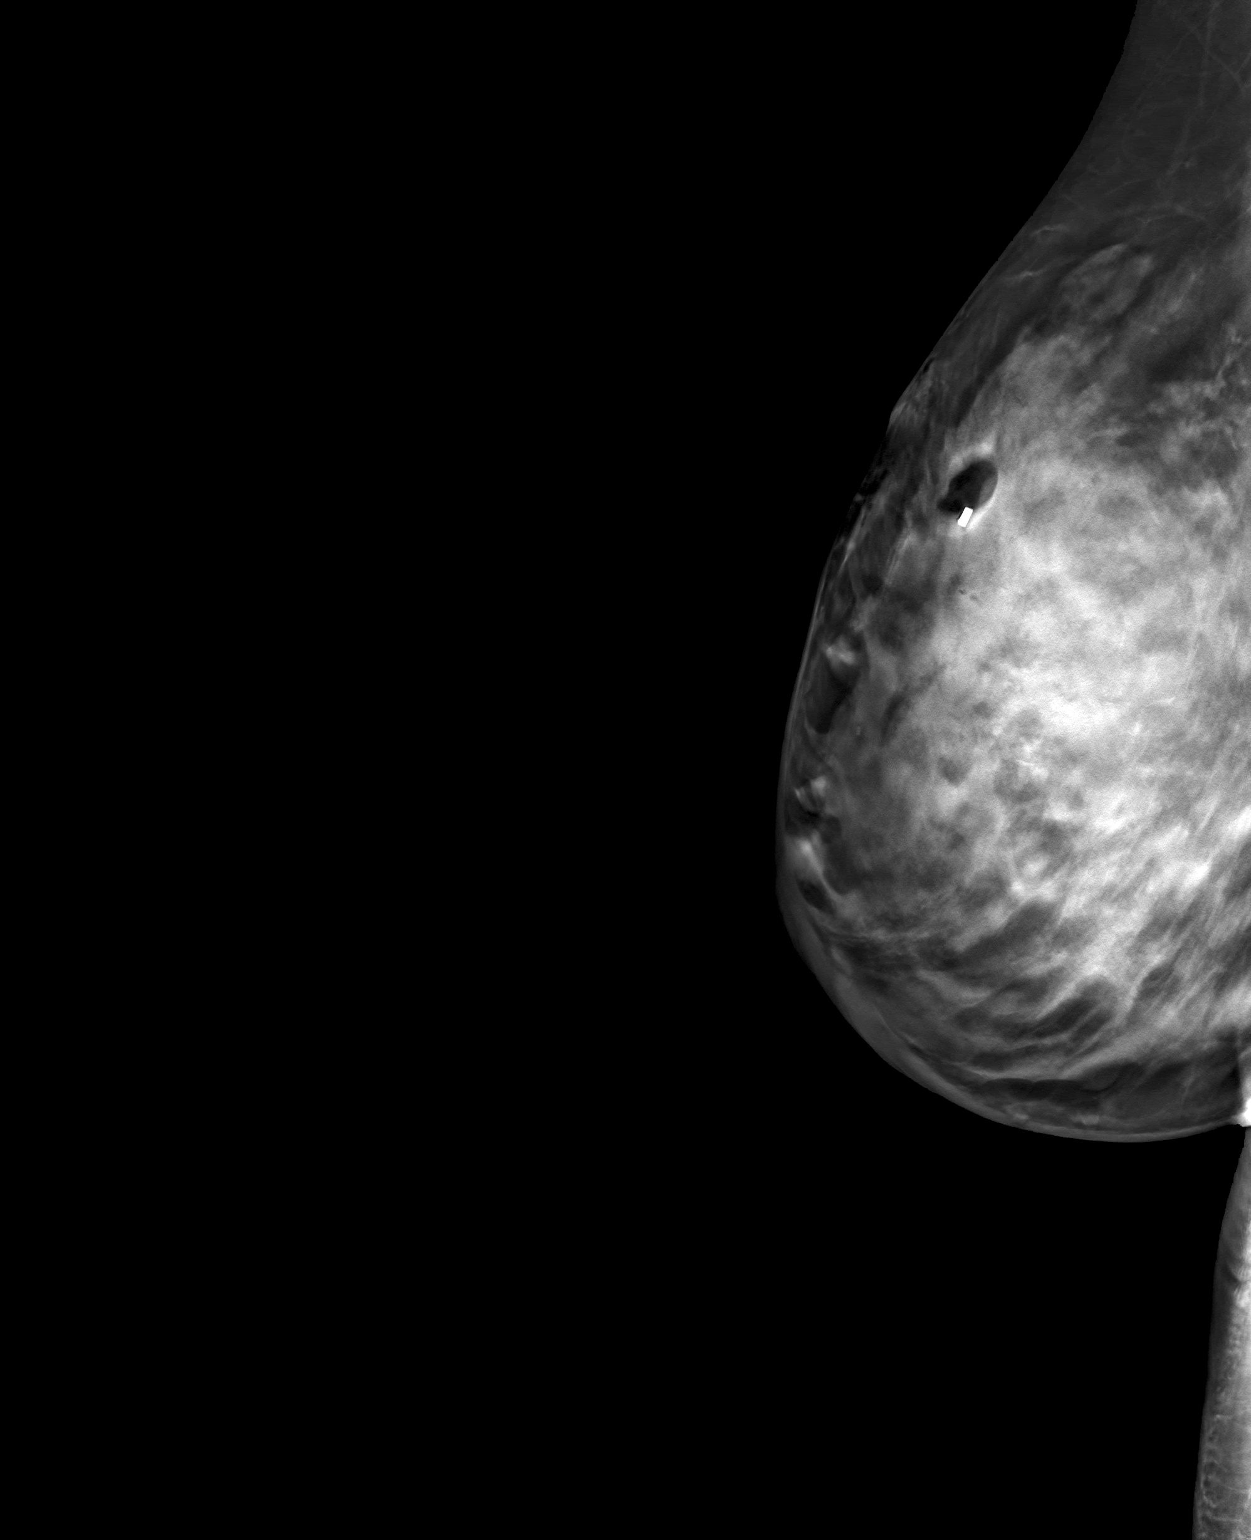

[3 of 7 positions shown; findings below may reference images not displayed]

FINDINGS: Mammographic images were obtained following MR guided biopsy of
cm non masslike enhancement within the UPPER-OUTER RIGHT breast.
The CYLINDER biopsy marking clip is in expected position at the site
of biopsy.
IMPRESSION: Appropriate positioning of the CYLINDER shaped biopsy marking clip
at the site of biopsy in the UPPER OUTER RIGHT breast.

Final Assessment: Post Procedure Mammograms for Marker Placement

## 2021-11-23 DIAGNOSIS — B349 Viral infection, unspecified: Secondary | ICD-10-CM | POA: Diagnosis not present

## 2021-11-23 DIAGNOSIS — R319 Hematuria, unspecified: Secondary | ICD-10-CM | POA: Diagnosis not present

## 2021-11-23 DIAGNOSIS — M25561 Pain in right knee: Secondary | ICD-10-CM | POA: Diagnosis not present

## 2021-11-23 DIAGNOSIS — E7889 Other lipoprotein metabolism disorders: Secondary | ICD-10-CM | POA: Diagnosis not present

## 2021-11-23 DIAGNOSIS — G8929 Other chronic pain: Secondary | ICD-10-CM | POA: Diagnosis not present

## 2021-11-29 DIAGNOSIS — M25561 Pain in right knee: Secondary | ICD-10-CM | POA: Diagnosis not present

## 2021-12-05 DIAGNOSIS — R319 Hematuria, unspecified: Secondary | ICD-10-CM | POA: Diagnosis not present

## 2021-12-26 DIAGNOSIS — N921 Excessive and frequent menstruation with irregular cycle: Secondary | ICD-10-CM | POA: Diagnosis not present

## 2022-01-25 DIAGNOSIS — N921 Excessive and frequent menstruation with irregular cycle: Secondary | ICD-10-CM | POA: Diagnosis not present

## 2022-01-25 DIAGNOSIS — Z8049 Family history of malignant neoplasm of other genital organs: Secondary | ICD-10-CM | POA: Diagnosis not present

## 2022-01-25 DIAGNOSIS — Z720 Tobacco use: Secondary | ICD-10-CM | POA: Diagnosis not present

## 2022-01-25 DIAGNOSIS — Z9079 Acquired absence of other genital organ(s): Secondary | ICD-10-CM | POA: Diagnosis not present

## 2022-01-25 DIAGNOSIS — R9389 Abnormal findings on diagnostic imaging of other specified body structures: Secondary | ICD-10-CM | POA: Diagnosis not present

## 2022-01-25 DIAGNOSIS — D251 Intramural leiomyoma of uterus: Secondary | ICD-10-CM | POA: Diagnosis not present

## 2022-02-05 NOTE — Progress Notes (Signed)
VUS in WRN called c.1270-10T>G (Intronic) has been reclassified to "Likely Benign." The amended report date is 01/31/2022.

## 2022-02-06 ENCOUNTER — Encounter: Payer: Self-pay | Admitting: Licensed Clinical Social Worker

## 2022-03-18 DIAGNOSIS — M25571 Pain in right ankle and joints of right foot: Secondary | ICD-10-CM | POA: Diagnosis not present

## 2022-03-26 DIAGNOSIS — Z0000 Encounter for general adult medical examination without abnormal findings: Secondary | ICD-10-CM | POA: Diagnosis not present

## 2022-03-26 DIAGNOSIS — Z1211 Encounter for screening for malignant neoplasm of colon: Secondary | ICD-10-CM | POA: Diagnosis not present

## 2022-04-11 DIAGNOSIS — S82831A Other fracture of upper and lower end of right fibula, initial encounter for closed fracture: Secondary | ICD-10-CM | POA: Diagnosis not present

## 2022-05-20 DIAGNOSIS — Z111 Encounter for screening for respiratory tuberculosis: Secondary | ICD-10-CM | POA: Diagnosis not present

## 2022-07-05 DIAGNOSIS — Z1231 Encounter for screening mammogram for malignant neoplasm of breast: Secondary | ICD-10-CM | POA: Diagnosis not present

## 2022-07-09 DIAGNOSIS — Z23 Encounter for immunization: Secondary | ICD-10-CM | POA: Diagnosis not present

## 2022-07-24 DIAGNOSIS — L089 Local infection of the skin and subcutaneous tissue, unspecified: Secondary | ICD-10-CM | POA: Diagnosis not present

## 2022-07-24 DIAGNOSIS — B9689 Other specified bacterial agents as the cause of diseases classified elsewhere: Secondary | ICD-10-CM | POA: Diagnosis not present

## 2022-10-02 DIAGNOSIS — H5213 Myopia, bilateral: Secondary | ICD-10-CM | POA: Diagnosis not present

## 2022-10-17 DIAGNOSIS — H5203 Hypermetropia, bilateral: Secondary | ICD-10-CM | POA: Diagnosis not present

## 2022-10-17 DIAGNOSIS — H52221 Regular astigmatism, right eye: Secondary | ICD-10-CM | POA: Diagnosis not present

## 2022-10-22 ENCOUNTER — Telehealth: Payer: Self-pay

## 2022-10-22 NOTE — Telephone Encounter (Signed)
LVM for patient to call back. AS< CMA 

## 2022-10-30 ENCOUNTER — Encounter: Payer: Self-pay | Admitting: Gastroenterology

## 2022-11-19 ENCOUNTER — Ambulatory Visit (AMBULATORY_SURGERY_CENTER): Payer: Medicaid Other

## 2022-11-19 ENCOUNTER — Other Ambulatory Visit: Payer: Self-pay

## 2022-11-19 VITALS — Ht 66.0 in | Wt 165.0 lb

## 2022-11-19 DIAGNOSIS — Z1211 Encounter for screening for malignant neoplasm of colon: Secondary | ICD-10-CM

## 2022-11-19 MED ORDER — PLENVU 140 G PO SOLR: 1.0000 | Freq: Once | ORAL | 0 refills | Status: AC

## 2022-11-19 MED ORDER — PLENVU 140 G PO SOLR: 1.0000 | ORAL | Status: DC

## 2022-11-19 NOTE — Progress Notes (Signed)
No egg or soy allergy known to patient  No issues known to pt with past sedation with any surgeries or procedures Patient denies ever being told they had issues or difficulty with intubation  No FH of Malignant Hyperthermia Pt is not on diet pills Pt is not on  home 02  Pt is not on blood thinners  Pt denies issues with constipation  No A fib or A flutter Have any cardiac testing pending--no Pt instructed to use Single[employer] or GoodRx for a price reduction on prep  Can ambulate with out assistance 

## 2022-11-28 ENCOUNTER — Encounter: Payer: Self-pay | Admitting: Gastroenterology

## 2022-11-28 ENCOUNTER — Ambulatory Visit (AMBULATORY_SURGERY_CENTER): Payer: Medicaid Other | Admitting: Gastroenterology

## 2022-11-28 VITALS — BP 91/61 | HR 60 | Temp 97.5°F | Resp 15 | Ht 66.0 in | Wt 167.4 lb

## 2022-11-28 DIAGNOSIS — Z1211 Encounter for screening for malignant neoplasm of colon: Secondary | ICD-10-CM

## 2022-11-28 MED ORDER — SODIUM CHLORIDE 0.9 % IV SOLN: 500.0000 mL | INTRAVENOUS | Status: DC

## 2022-11-28 NOTE — Progress Notes (Signed)
Vssmnad trans to pacu 

## 2022-11-28 NOTE — Progress Notes (Signed)
Pt's states no medical or surgical changes since previsit or office visit. 

## 2022-11-28 NOTE — Progress Notes (Signed)
[facility] History and Physical   Primary Care Physician:  Iona Hansen, NP   Reason for Procedure:   Colon cancer screening  Plan:    Screening colonoscopy     HPI: Dominica T Voshell is a 52 y.o. female undergoing initial average risk screening colonoscopy.  She has no family history of colon cancer and no chronic GI symptoms.    Past Medical History:  Diagnosis Date   Family history of brain cancer    Family history of breast cancer    Family history of ovarian cancer     Past Surgical History:  Procedure Laterality Date   BACK SURGERY     BIOPSY BREAST Left 2016   IUD REMOVAL N/A 04/14/2019   Procedure: INTRAUTERINE DEVICE (IUD) REMOVAL;  Surgeon: Reva Bores, MD;  Location: [facility];  Service: Gynecology;  Laterality: N/A;   LAPAROSCOPIC UNILATERAL SALPINGECTOMY Left 04/14/2019   Procedure: LAPAROSCOPIC UNILATERAL SALPINGECTOMY;  Surgeon: Reva Bores, MD;  Location: [facility];  Service: Gynecology;  Laterality: Left;    Prior to Admission medications   Medication Sig Start Date End Date Taking? Authorizing Provider  ibuprofen (ADVIL) 200 MG tablet Take 200 mg by mouth every 6 (six) hours as needed. Patient not taking: Reported on 11/19/2022    [provider]  Multiple Vitamin (MULTIVITAMIN) capsule Take 1 capsule by mouth daily.    [provider]  oxyCODONE (OXY IR/ROXICODONE) 5 MG immediate release tablet Take 1 tablet (5 mg total) by mouth every 6 (six) hours as needed for severe pain. Patient not taking: Reported on 11/19/2022 04/14/19   Reva Bores, MD    Current Outpatient Medications  Medication Sig Dispense Refill   ibuprofen (ADVIL) 200 MG tablet Take 200 mg by mouth every 6 (six) hours as needed. (Patient not taking: Reported on 11/19/2022)     Multiple Vitamin (MULTIVITAMIN) capsule Take 1 capsule by mouth daily.     oxyCODONE (OXY IR/ROXICODONE) 5 MG immediate release tablet Take 1 tablet  (5 mg total) by mouth every 6 (six) hours as needed for severe pain. (Patient not taking: Reported on 11/19/2022) 15 tablet 0   Current Facility-Administered Medications  Medication Dose Route Frequency Provider Last Rate Last Admin   0.9 %  sodium chloride infusion  500 mL Intravenous Continuous Jenel Lucks, MD        Allergies as of 11/28/2022 - Review Complete 11/28/2022  Allergen Reaction Noted   Shellfish allergy Itching 03/05/2015    Family History  Problem Relation Age of Onset   Diabetes Mother    Breast cancer Mother 28   Heart disease Father    Breast cancer Sister 62   Ovarian cancer Maternal Aunt        dx 46s   Leukemia Maternal Uncle    Bone cancer Maternal Uncle    Ovarian cancer Paternal Aunt        d. 46   Breast cancer Paternal Aunt        d. 64   Brain cancer Paternal Uncle    Esophageal cancer Other    Colon cancer Neg Hx    Colon polyps Neg Hx    Rectal cancer Neg Hx    Stomach cancer Neg Hx     Social History   Socioeconomic History   Marital status: Married    Spouse name: Not on file   Number of children: Not on file   Years of education: Not on  file   Highest education level: Not on file  Occupational History   Not on file  Tobacco Use   Smoking status: Never   Smokeless tobacco: Never   Tobacco comments:    once in awhile  Vaping Use   Vaping Use: Some days  Substance and Sexual Activity   Alcohol use: No   Drug use: No   Sexual activity: Yes    Birth control/protection: None  Other Topics Concern   Not on file  Social History Narrative   Not on file   Social Determinants of Health   Financial Resource Strain: Not on file  Food Insecurity: Not on file  Transportation Needs: Not on file  Physical Activity: Not on file  Stress: Not on file  Social Connections: Not on file  Intimate Partner Violence: Not on file    Review of Systems:  All other review of systems negative except as mentioned in the HPI.  Physical  Exam: Vital signs BP 111/68   Pulse 82   Temp (!) 97.5 F (36.4 C) (Temporal)   Ht 5\' 6"  (1.676 m)   Wt 167 lb 6.4 oz (75.9 kg)   SpO2 97%   BMI 27.02 kg/m   General:   Alert,  Well-developed, well-nourished, pleasant and cooperative in NAD Airway:  Mallampati 2 Lungs:  Clear throughout to auscultation.   Heart:  Regular rate and rhythm; no murmurs, clicks, rubs,  or gallops. Abdomen:  Soft, nontender and nondistended. Normal bowel sounds.   Neuro/Psych:  Normal mood and affect. A and O x 3   Amea Mcphail E. Tomasa Rand, MD [facility]

## 2022-11-28 NOTE — Patient Instructions (Signed)
Resume previous diet and continue present medications. Repeat colonoscopy in 10 years for surveillance!!   YOU HAD AN ENDOSCOPIC PROCEDURE TODAY AT THE [facility]:   Refer to the procedure report that was given to you for any specific questions about what was found during the examination.  If the procedure report does not answer your questions, please call your gastroenterologist to clarify.  If you requested that your care partner not be given the details of your procedure findings, then the procedure report has been included in a sealed envelope for you to review at your convenience later.  YOU SHOULD EXPECT: Some feelings of bloating in the abdomen. Passage of more gas than usual.  Walking can help get rid of the air that was put into your GI tract during the procedure and reduce the bloating. If you had a lower endoscopy (such as a colonoscopy or flexible sigmoidoscopy) you may notice spotting of blood in your stool or on the toilet paper. If you underwent a bowel prep for your procedure, you may not have a normal bowel movement for a few days.  Please Note:  You might notice some irritation and congestion in your nose or some drainage.  This is from the oxygen used during your procedure.  There is no need for concern and it should clear up in a day or so.  SYMPTOMS TO REPORT IMMEDIATELY:  Following lower endoscopy (colonoscopy or flexible sigmoidoscopy):  Excessive amounts of blood in the stool  Significant tenderness or worsening of abdominal pains  Swelling of the abdomen that is new, acute  Fever of 100F or higher  For urgent or emergent issues, a gastroenterologist can be reached at any hour by calling [phone]. Do not use MyChart messaging for urgent concerns.    DIET:  We do recommend a small meal at first, but then you may proceed to your regular diet.  Drink plenty of fluids but you should avoid alcoholic beverages for 24 hours.  ACTIVITY:  You should plan to  take it easy for the rest of today and you should NOT DRIVE or use heavy machinery until tomorrow (because of the sedation medicines used during the test).    FOLLOW UP: Our staff will call the number listed on your records the next business day following your procedure.  We will call around 7:15- 8:00 am to check on you and address any questions or concerns that you may have regarding the information given to you following your procedure. If we do not reach you, we will leave a message.     If any biopsies were taken you will be contacted by phone or by letter within the next 1-3 weeks.  Please call us at [phone] if you have not heard about the biopsies in 3 weeks.    SIGNATURES/CONFIDENTIALITY: You and/or your care partner have signed paperwork which will be entered into your electronic medical record.  These signatures attest to the fact that that the information above on your After Visit Summary has been reviewed and is understood.  Full responsibility of the confidentiality of this discharge information lies with you and/or your care-partner. 

## 2022-11-28 NOTE — Op Note (Signed)
[facility] Patient Name: [name] [name] Procedure Date: 11/28/2022 9:53 AM MRN: [id number] Endoscopist: Lorin Picket E. Tomasa Rand , MD, [id number] Age: 52 Referring MD:  Date of Birth: 09/23/1970 Gender: Female Account #: [id number] Procedure:                Colonoscopy Indications:              Screening for colorectal malignant neoplasm, This                            is the patient's first colonoscopy Medicines:                Monitored Anesthesia Care Procedure:                Pre-Anesthesia Assessment:                           - Prior to the procedure, a History and Physical                            was performed, and patient medications and                            allergies were reviewed. The patient's tolerance of                            previous anesthesia was also reviewed. The risks                            and benefits of the procedure and the sedation                            options and risks were discussed with the patient.                            All questions were answered, and informed consent                            was obtained. Prior Anticoagulants: The patient has                            taken no anticoagulant or antiplatelet agents. ASA                            Grade Assessment: II - A patient with mild systemic                            disease. After reviewing the risks and benefits,                            the patient was deemed in satisfactory condition to                            undergo the procedure.  After obtaining informed consent, the colonoscope                            was passed under direct vision. Throughout the                            procedure, the patient's blood pressure, pulse, and                            oxygen saturations were monitored continuously. The                            CF [id number] #[id number] was introduced through the anus                            and advanced to the  the terminal ileum, with                            identification of the appendiceal orifice and IC                            valve. The colonoscopy was performed without                            difficulty. The patient tolerated the procedure                            well. The quality of the bowel preparation was                            excellent. The terminal ileum, ileocecal valve,                            appendiceal orifice, and rectum were photographed.                            The bowel preparation used was SUPREP via split                            dose instruction. Scope In: 10:04:33 AM Scope Out: 10:15:32 AM Scope Withdrawal Time: 0 hours 7 minutes 1 second  Total Procedure Duration: 0 hours 10 minutes 59 seconds  Findings:                 Skin tags were found on perianal exam.                           The digital rectal exam was normal. Pertinent                            negatives include normal sphincter tone and no                            palpable rectal lesions.  The colon (entire examined portion) appeared normal.                           The terminal ileum appeared normal.                           The retroflexed view of the distal rectum and anal                            verge was normal and showed no anal or rectal                            abnormalities. Complications:            No immediate complications. Estimated Blood Loss:     Estimated blood loss: none. Impression:               - Perianal skin tags found on perianal exam.                           - The entire examined colon is normal.                           - The examined portion of the ileum was normal.                           - The distal rectum and anal verge are normal on                            retroflexion view.                           - No specimens collected. Recommendation:           - Patient has a contact number available for                             emergencies. The signs and symptoms of potential                            delayed complications were discussed with the                            patient. Return to normal activities tomorrow.                            Written discharge instructions were provided to the                            patient.                           - Resume previous diet.                           - Continue present medications.                           -  Repeat colonoscopy in 10 years for screening                            purposes. Sufyan Meidinger E. Tomasa Rand, MD 11/28/2022 10:18:37 AM This report has been signed electronically.

## 2022-11-29 ENCOUNTER — Telehealth: Payer: Self-pay | Admitting: *Deleted

## 2022-11-29 NOTE — Telephone Encounter (Signed)
  Follow up Call-    Row Labels 11/28/2022    9:28 AM  Call back number   Section Header. No data exists in this row.   Post procedure Call Back phone  #   [phone]  Permission to leave phone message   Yes     Patient questions:  Do you have a fever, pain , or abdominal swelling? No. Pain Score  0 *  Have you tolerated food without any problems? Yes.    Have you been able to return to your normal activities? Yes.    Do you have any questions about your discharge instructions: Diet   No. Medications  No. Follow up visit  No.  Do you have questions or concerns about your Care? No.  Actions: * If pain score is 4 or above: No action needed, pain <4.

## 2022-12-10 DIAGNOSIS — S299XXA Unspecified injury of thorax, initial encounter: Secondary | ICD-10-CM | POA: Diagnosis not present

## 2023-05-20 DIAGNOSIS — Z0120 Encounter for dental examination and cleaning without abnormal findings: Secondary | ICD-10-CM | POA: Diagnosis not present

## 2023-07-31 DIAGNOSIS — Z1231 Encounter for screening mammogram for malignant neoplasm of breast: Secondary | ICD-10-CM | POA: Diagnosis not present

## 2023-10-10 DIAGNOSIS — Z23 Encounter for immunization: Secondary | ICD-10-CM | POA: Diagnosis not present
